# Patient Record
Sex: Female | Born: 1980
Health system: Southern US, Community
[De-identification: ages and names within clinical notes are randomized; demographics above are authoritative.]

## PROBLEM LIST (undated history)

## (undated) DIAGNOSIS — O99019 Anemia complicating pregnancy, unspecified trimester: Secondary | ICD-10-CM

## (undated) DIAGNOSIS — R87619 Unspecified abnormal cytological findings in specimens from cervix uteri: Secondary | ICD-10-CM

## (undated) DIAGNOSIS — R0789 Other chest pain: Principal | ICD-10-CM

## (undated) DIAGNOSIS — D509 Iron deficiency anemia, unspecified: Secondary | ICD-10-CM

## (undated) DIAGNOSIS — I10 Essential (primary) hypertension: Secondary | ICD-10-CM

## (undated) DIAGNOSIS — O139 Gestational [pregnancy-induced] hypertension without significant proteinuria, unspecified trimester: Secondary | ICD-10-CM

## (undated) DIAGNOSIS — IMO0002 Reserved for concepts with insufficient information to code with codable children: Secondary | ICD-10-CM

## (undated) HISTORY — DX: Other chest pain: R07.89

## (undated) HISTORY — PX: LEEP: SHX91

## (undated) HISTORY — DX: Essential (primary) hypertension: I10

---

## 2003-01-17 ENCOUNTER — Emergency Department (HOSPITAL_COMMUNITY): Admission: EM | Admit: 2003-01-17 | Discharge: 2003-01-17 | Payer: Self-pay | Admitting: *Deleted

## 2003-01-17 ENCOUNTER — Encounter: Payer: Self-pay | Admitting: *Deleted

## 2009-05-27 ENCOUNTER — Emergency Department (HOSPITAL_COMMUNITY): Admission: EM | Admit: 2009-05-27 | Discharge: 2009-05-27 | Payer: Self-pay | Admitting: Family Medicine

## 2011-01-04 LAB — ABO/RH: RH Type: POSITIVE

## 2011-01-04 LAB — HIV ANTIBODY (ROUTINE TESTING W REFLEX): HIV: NONREACTIVE

## 2011-01-04 LAB — RUBELLA ANTIBODY, IGM: Rubella: IMMUNE

## 2011-01-04 LAB — HEPATITIS B SURFACE ANTIGEN: Hepatitis B Surface Ag: NEGATIVE

## 2011-01-04 LAB — RPR: RPR: NONREACTIVE

## 2011-01-04 LAB — ANTIBODY SCREEN: Antibody Screen: NEGATIVE

## 2011-07-04 ENCOUNTER — Encounter (HOSPITAL_COMMUNITY): Payer: Self-pay | Admitting: *Deleted

## 2011-07-04 ENCOUNTER — Inpatient Hospital Stay (HOSPITAL_COMMUNITY)
Admission: AD | Admit: 2011-07-04 | Discharge: 2011-07-04 | Disposition: A | Discharge status: Home / Self Care | Payer: 59 | Source: Ambulatory Visit | Attending: Obstetrics & Gynecology | Admitting: Obstetrics & Gynecology

## 2011-07-04 ENCOUNTER — Inpatient Hospital Stay (HOSPITAL_COMMUNITY): Payer: 59

## 2011-07-04 DIAGNOSIS — O10919 Unspecified pre-existing hypertension complicating pregnancy, unspecified trimester: Secondary | ICD-10-CM

## 2011-07-04 DIAGNOSIS — O10019 Pre-existing essential hypertension complicating pregnancy, unspecified trimester: Secondary | ICD-10-CM | POA: Insufficient documentation

## 2011-07-04 HISTORY — DX: Essential (primary) hypertension: I10

## 2011-07-04 LAB — COMPREHENSIVE METABOLIC PANEL
ALT: 14 U/L (ref 0–35)
AST: 17 U/L (ref 0–37)
Albumin: 2.4 g/dL — ABNORMAL LOW (ref 3.5–5.2)
Alkaline Phosphatase: 116 U/L (ref 39–117)
BUN: 8 mg/dL (ref 6–23)
CO2: 26 mEq/L (ref 19–32)
Calcium: 10 mg/dL (ref 8.4–10.5)
Chloride: 104 mEq/L (ref 96–112)
Creatinine, Ser: 0.5 mg/dL (ref 0.50–1.10)
GFR calc Af Amer: >60 mL/min (ref >60)
GFR calc non Af Amer: >60 mL/min (ref >60)
Glucose, Bld: 95 mg/dL (ref 70–99)
Potassium: 3.1 mEq/L — ABNORMAL LOW (ref 3.5–5.1)
Sodium: 136 mEq/L (ref 135–145)
Total Bilirubin: 0.1 mg/dL — ABNORMAL LOW (ref 0.3–1.2)
Total Protein: 6.4 g/dL (ref 6.0–8.3)

## 2011-07-04 LAB — CBC
HCT: 32.1 % — ABNORMAL LOW (ref 36.0–46.0)
Hemoglobin: 10.7 g/dL — ABNORMAL LOW (ref 12.0–15.0)
MCH: 28.1 pg (ref 26.0–34.0)
MCHC: 33.3 g/dL (ref 30.0–36.0)
MCV: 84.3 fL (ref 78.0–100.0)
Platelets: 275 10*3/uL (ref 150–400)
RBC: 3.81 MIL/uL — ABNORMAL LOW (ref 3.87–5.11)
RDW: 13.8 % (ref 11.5–15.5)
WBC: 11.6 10*3/uL — ABNORMAL HIGH (ref 4.0–10.5)

## 2011-07-04 LAB — URIC ACID: Uric Acid, Serum: 3.6 mg/dL (ref 2.4–7.0)

## 2011-07-04 MED ORDER — ACETAMINOPHEN 500 MG PO TABS
1000.0000 mg | ORAL_TABLET | Freq: Once | ORAL | Status: AC
  Administered 2011-07-04: 1000 mg via ORAL
  Filled 2011-07-04: qty 2

## 2011-07-04 MED ORDER — LABETALOL HCL 100 MG PO TABS: 100.0000 mg | ORAL_TABLET | Freq: Two times a day (BID) | ORAL | Status: DC

## 2011-07-04 MED ORDER — POTASSIUM CHLORIDE CRYS ER 20 MEQ PO TBCR: 40.0000 meq | EXTENDED_RELEASE_TABLET | Freq: Two times a day (BID) | ORAL | Status: DC

## 2011-07-04 MED ORDER — POTASSIUM CHLORIDE CRYS ER 20 MEQ PO TBCR
40.0000 meq | EXTENDED_RELEASE_TABLET | Freq: Once | ORAL | Status: AC
  Administered 2011-07-04: 40 meq via ORAL
  Filled 2011-07-04: qty 2

## 2011-07-04 MED ORDER — LABETALOL HCL 100 MG PO TABS
100.0000 mg | ORAL_TABLET | Freq: Two times a day (BID) | ORAL | Status: DC
  Administered 2011-07-04: 100 mg via ORAL
  Filled 2011-07-04: qty 1

## 2011-07-04 NOTE — Progress Notes (Signed)
Pt sent over from office for pih eval, reports headache that started last night.  Reports increased swelling in past couple days.  Denies any dizziness or blurred vision.  Denies any bleeding or leaking of fluid.  + FM.

## 2011-07-04 NOTE — Progress Notes (Signed)
History   30 yo G1P0 BF w/ underlying HTN on no med now @ [redacted] wk gestation sent from the office for further evaluation of elev BP( 150./90), persistent h/a and leg swelling. (+) heartburn w/o change. 4lb weight gain since last tues (-)visual changes  Chief Complaint  Patient presents with  . Hypertension  . Headache     OB History    Grav Para Term Preterm Abortions TAB SAB Ect Mult Living   1               Past Medical History  Diagnosis Date  . Hypertension     Past Surgical History  Procedure Date  . No past surgeries   . Leep     No family history on file.  History  Substance Use Topics  . Smoking status: Never Smoker   . Smokeless tobacco: Not on file  . Alcohol Use: No    Allergies: No Known Allergies  Prescriptions prior to admission  Medication Sig Dispense Refill  . acetaminophen (TYLENOL) 500 MG tablet Take 500 mg by mouth every 6 (six) hours as needed.        Marland Kitchen levocetirizine (XYZAL) 5 MG tablet Take 5 mg by mouth every evening.        . prenatal vitamin w/FE, FA (PRENATAL 1 + 1) 27-1 MG TABS Take 1 tablet by mouth daily.           Physical Exam   Blood pressure 153/73, pulse 78, temperature 98.1 F (36.7 C), temperature source Oral, resp. rate 18, height 5\' 9"  (1.753 m), weight 102.967 kg (227 lb).  No exam performed today,  done in office today. ED Course  PIH labs nl.  U/s: 5lb 10 oz(59%) , nl AFI  A) Chronic HTN w/o superimposed preeclampsia IUP @ 35 wk 3) hypokalemia P) d/c home. Toxemia warning signs. Keep ob appt Fri. Start labetalol 100mg  po bid. May use vicodin prn h/a. Start K supplementation. D/c home after reactive NST. Tylenol 1000mg  po x 1 now  Tekila Caillouet A, MD 6:05 PM 07/04/2011

## 2011-07-04 NOTE — Discharge Instructions (Signed)
Call if decreased FM, ctx q 5 mins or greater, rupture of membrane

## 2011-07-19 ENCOUNTER — Encounter (HOSPITAL_COMMUNITY): Payer: Self-pay | Admitting: *Deleted

## 2011-07-19 ENCOUNTER — Inpatient Hospital Stay (HOSPITAL_COMMUNITY): Payer: 59

## 2011-07-19 ENCOUNTER — Inpatient Hospital Stay (HOSPITAL_COMMUNITY)
Admission: AD | Admit: 2011-07-19 | Discharge: 2011-07-24 | DRG: 765 | Disposition: A | Discharge status: Home / Self Care | Payer: 59 | Source: Ambulatory Visit | Attending: Obstetrics and Gynecology | Admitting: Obstetrics and Gynecology

## 2011-07-19 DIAGNOSIS — O9903 Anemia complicating the puerperium: Secondary | ICD-10-CM | POA: Diagnosis present

## 2011-07-19 DIAGNOSIS — O139 Gestational [pregnancy-induced] hypertension without significant proteinuria, unspecified trimester: Secondary | ICD-10-CM | POA: Diagnosis present

## 2011-07-19 DIAGNOSIS — D509 Iron deficiency anemia, unspecified: Secondary | ICD-10-CM | POA: Diagnosis present

## 2011-07-19 DIAGNOSIS — O99019 Anemia complicating pregnancy, unspecified trimester: Secondary | ICD-10-CM

## 2011-07-19 DIAGNOSIS — O411090 Infection of amniotic sac and membranes, unspecified, unspecified trimester, not applicable or unspecified: Secondary | ICD-10-CM | POA: Diagnosis present

## 2011-07-19 HISTORY — DX: Anemia complicating pregnancy, unspecified trimester: O99.019

## 2011-07-19 HISTORY — DX: Iron deficiency anemia, unspecified: D50.9

## 2011-07-19 HISTORY — DX: Gestational (pregnancy-induced) hypertension without significant proteinuria, unspecified trimester: O13.9

## 2011-07-19 HISTORY — DX: Unspecified abnormal cytological findings in specimens from cervix uteri: R87.619

## 2011-07-19 HISTORY — DX: Reserved for concepts with insufficient information to code with codable children: IMO0002

## 2011-07-19 LAB — COMPREHENSIVE METABOLIC PANEL
ALT: 8 U/L (ref 0–35)
ALT: 9 U/L (ref 0–35)
AST: 15 U/L (ref 0–37)
AST: 19 U/L (ref 0–37)
Albumin: 2.3 g/dL — ABNORMAL LOW (ref 3.5–5.2)
Albumin: 2.5 g/dL — ABNORMAL LOW (ref 3.5–5.2)
Alkaline Phosphatase: 137 U/L — ABNORMAL HIGH (ref 39–117)
Alkaline Phosphatase: 149 U/L — ABNORMAL HIGH (ref 39–117)
BUN: 8 mg/dL (ref 6–23)
BUN: 8 mg/dL (ref 6–23)
CO2: 24 mEq/L (ref 19–32)
CO2: 24 mEq/L (ref 19–32)
Calcium: 9.5 mg/dL (ref 8.4–10.5)
Calcium: 9.8 mg/dL (ref 8.4–10.5)
Chloride: 106 mEq/L (ref 96–112)
Chloride: 103 mEq/L (ref 96–112)
Creatinine, Ser: 0.53 mg/dL (ref 0.50–1.10)
Creatinine, Ser: 0.47 mg/dL — ABNORMAL LOW (ref 0.50–1.10)
GFR calc Af Amer: >90 mL/min (ref >90)
GFR calc Af Amer: >90 mL/min (ref >90)
GFR calc non Af Amer: >90 mL/min (ref >90)
GFR calc non Af Amer: >90 mL/min (ref >90)
Glucose, Bld: 100 mg/dL — ABNORMAL HIGH (ref 70–99)
Glucose, Bld: 108 mg/dL — ABNORMAL HIGH (ref 70–99)
Potassium: 3.9 mEq/L (ref 3.5–5.1)
Potassium: 4.2 mEq/L (ref 3.5–5.1)
Sodium: 137 mEq/L (ref 135–145)
Sodium: 135 mEq/L (ref 135–145)
Total Bilirubin: 0.1 mg/dL — ABNORMAL LOW (ref 0.3–1.2)
Total Bilirubin: 0.1 mg/dL — ABNORMAL LOW (ref 0.3–1.2)
Total Protein: 6.2 g/dL (ref 6.0–8.3)
Total Protein: 6.5 g/dL (ref 6.0–8.3)

## 2011-07-19 LAB — CBC
HCT: 32.9 % — ABNORMAL LOW (ref 36.0–46.0)
HCT: 34.4 % — ABNORMAL LOW (ref 36.0–46.0)
Hemoglobin: 10.7 g/dL — ABNORMAL LOW (ref 12.0–15.0)
Hemoglobin: 11.2 g/dL — ABNORMAL LOW (ref 12.0–15.0)
MCH: 27.4 pg (ref 26.0–34.0)
MCH: 27.5 pg (ref 26.0–34.0)
MCHC: 32.5 g/dL (ref 30.0–36.0)
MCHC: 32.6 g/dL (ref 30.0–36.0)
MCV: 84.4 fL (ref 78.0–100.0)
MCV: 84.3 fL (ref 78.0–100.0)
Platelets: 283 10*3/uL (ref 150–400)
Platelets: 309 10*3/uL (ref 150–400)
RBC: 3.9 MIL/uL (ref 3.87–5.11)
RBC: 4.08 MIL/uL (ref 3.87–5.11)
RDW: 13.8 % (ref 11.5–15.5)
RDW: 13.9 % (ref 11.5–15.5)
WBC: 9.9 10*3/uL (ref 4.0–10.5)
WBC: 10.5 10*3/uL (ref 4.0–10.5)

## 2011-07-19 LAB — RPR: RPR Ser Ql: NONREACTIVE

## 2011-07-19 LAB — URIC ACID
Uric Acid, Serum: 4 mg/dL (ref 2.4–7.0)
Uric Acid, Serum: 3.9 mg/dL (ref 2.4–7.0)

## 2011-07-19 MED ORDER — SODIUM CHLORIDE 0.9 % IJ SOLN: 3.0000 mL | INTRAMUSCULAR | Status: DC | PRN

## 2011-07-19 MED ORDER — LACTATED RINGERS IV SOLN: 500.0000 mL | INTRAVENOUS | Status: DC | PRN

## 2011-07-19 MED ORDER — CALCIUM CARBONATE ANTACID 500 MG PO CHEW: 2.0000 | CHEWABLE_TABLET | ORAL | Status: DC | PRN

## 2011-07-19 MED ORDER — ACETAMINOPHEN 325 MG PO TABS
650.0000 mg | ORAL_TABLET | ORAL | Status: DC | PRN
  Administered 2011-07-19 – 2011-07-21 (×2): 650 mg via ORAL
  Filled 2011-07-19 (×2): qty 2

## 2011-07-19 MED ORDER — ZOLPIDEM TARTRATE 10 MG PO TABS: 10.0000 mg | ORAL_TABLET | Freq: Every evening | ORAL | Status: DC | PRN

## 2011-07-19 MED ORDER — OXYTOCIN 10 UNIT/ML IJ SOLN: 10.0000 [IU] | Freq: Once | INTRAMUSCULAR | Status: DC

## 2011-07-19 MED ORDER — OXYTOCIN BOLUS FROM INFUSION
500.0000 mL | Freq: Once | INTRAVENOUS | Status: DC
  Filled 2011-07-19: qty 500

## 2011-07-19 MED ORDER — NIFEDIPINE ER 30 MG PO TB24
30.0000 mg | ORAL_TABLET | Freq: Every day | ORAL | Status: DC
  Administered 2011-07-19 – 2011-07-24 (×6): 30 mg via ORAL
  Filled 2011-07-19 (×8): qty 1

## 2011-07-19 MED ORDER — LACTATED RINGERS IV SOLN
INTRAVENOUS | Status: DC
  Administered 2011-07-19 – 2011-07-21 (×5): via INTRAVENOUS

## 2011-07-19 MED ORDER — DOCUSATE SODIUM 100 MG PO CAPS
100.0000 mg | ORAL_CAPSULE | Freq: Every day | ORAL | Status: DC
  Administered 2011-07-20: 100 mg via ORAL
  Filled 2011-07-19: qty 1

## 2011-07-19 MED ORDER — IBUPROFEN 600 MG PO TABS: 600.0000 mg | ORAL_TABLET | Freq: Four times a day (QID) | ORAL | Status: DC | PRN

## 2011-07-19 MED ORDER — TERBUTALINE SULFATE 1 MG/ML IJ SOLN: 0.2500 mg | Freq: Once | INTRAMUSCULAR | Status: AC | PRN

## 2011-07-19 MED ORDER — OXYTOCIN 20 UNITS IN LACTATED RINGERS INFUSION - SIMPLE: 125.0000 mL/h | Freq: Once | INTRAVENOUS | Status: DC

## 2011-07-19 MED ORDER — LIDOCAINE HCL (PF) 1 % IJ SOLN: 30.0000 mL | INTRAMUSCULAR | Status: DC | PRN

## 2011-07-19 MED ORDER — HYDRALAZINE HCL 20 MG/ML IJ SOLN
5.0000 mg | Freq: Once | INTRAMUSCULAR | Status: AC
  Administered 2011-07-19: 5 mg via INTRAVENOUS
  Filled 2011-07-19: qty 1

## 2011-07-19 MED ORDER — CITRIC ACID-SODIUM CITRATE 334-500 MG/5ML PO SOLN
30.0000 mL | ORAL | Status: DC | PRN
  Administered 2011-07-21: 30 mL via ORAL
  Filled 2011-07-19: qty 15

## 2011-07-19 MED ORDER — ACETAMINOPHEN 325 MG PO TABS: 650.0000 mg | ORAL_TABLET | ORAL | Status: DC | PRN

## 2011-07-19 MED ORDER — PRENATAL PLUS 27-1 MG PO TABS
1.0000 | ORAL_TABLET | Freq: Every day | ORAL | Status: DC
  Filled 2011-07-19: qty 1

## 2011-07-19 MED ORDER — ONDANSETRON HCL 4 MG/2ML IJ SOLN: 4.0000 mg | Freq: Four times a day (QID) | INTRAMUSCULAR | Status: DC | PRN

## 2011-07-19 MED ORDER — MISOPROSTOL 25 MCG QUARTER TABLET
25.0000 ug | ORAL_TABLET | ORAL | Status: DC | PRN
  Administered 2011-07-19 – 2011-07-20 (×3): 25 ug via VAGINAL
  Filled 2011-07-19 (×3): qty 0.25

## 2011-07-19 MED ORDER — SODIUM CHLORIDE 0.9 % IJ SOLN: 3.0000 mL | Freq: Two times a day (BID) | INTRAMUSCULAR | Status: DC

## 2011-07-19 MED ORDER — OXYCODONE-ACETAMINOPHEN 5-325 MG PO TABS
2.0000 | ORAL_TABLET | ORAL | Status: DC | PRN
  Administered 2011-07-19: 1 via ORAL
  Filled 2011-07-19: qty 2

## 2011-07-19 MED ORDER — FLEET ENEMA 7-19 GM/118ML RE ENEM: 1.0000 | ENEMA | RECTAL | Status: DC | PRN

## 2011-07-19 MED ORDER — SODIUM CHLORIDE 0.9 % IV SOLN: 250.0000 mL | INTRAVENOUS | Status: DC

## 2011-07-19 NOTE — H&P (Signed)
Sabrina [name] is a 30 y.o. female presenting@ 37 1/[redacted] wk gestation for further evaluation of elev BP in office( 146/98) w/ complaint of h/a and seeing spots. Pt has had a 6lb weight gain in a week. (+) heartburn unchanged. (+) increased leg swelling. BP here 131/80 to 140/104. PIH labs nl Fetal growth 6lb 1 oz(39%) but decreased since last study 1 mth ago Maternal Medical History:  Reason for admission: Reason for Admission:   nausea  OB History    Grav Para Term Preterm Abortions TAB SAB Ect Mult Living   1              Past Medical History  Diagnosis Date  . Hypertension   . Pregnancy induced hypertension   . Abnormal Pap smear    Past Surgical History  Procedure Date  . Leep    Family History: family history is not on file. Social History:  reports that she has never smoked. She has never used smokeless tobacco. She reports that she does not drink alcohol or use illicit drugs.  Review of Systems  Eyes: Negative for blurred vision.  Gastrointestinal: Negative for nausea, vomiting and abdominal pain.  All other systems reviewed and are negative.      Blood pressure 131/80, pulse 78, temperature 99.3 F (37.4 C), temperature source Oral, resp. rate 18, height 5\' 9"  (1.753 m), weight 230 lb (104.327 kg). Exam Physical Exam  Constitutional: She is oriented to person, place, and time. She appears well-developed and well-nourished.  HENT:  Head: Normocephalic and atraumatic.  Eyes: EOM are normal.  Neck: Neck supple.  Cardiovascular: Normal rate and regular rhythm.   Respiratory: Breath sounds normal.  GI: Soft.  Musculoskeletal: She exhibits edema.       2 plus pitting edema  Neurological: She is alert and oriented to person, place, and time. She displays normal reflexes.  Skin: Skin is warm and dry.  Psychiatric: She has a normal mood and affect.   VE dimple/80/-2 Prenatal labs: ABO, Rh:  O positive Antibody:  neg Rubella:  Immune RPR:   NR HBsAg:   neg HIV:    neg GBS:   neg Tracing reactive. Baseline 130 Assessment/Plan: PIH Labile HTN IUP @ 37 1/7 P) admit 2) monitor BP 3) start 24 hr UTP/CrCL, 4) procardia XL( pt did not tol labetalol in the past) 5) induction if BP still labile  Sabrina [name] A 07/19/2011, 1:18 PM

## 2011-07-19 NOTE — ED Provider Notes (Signed)
History   30 yo G1P0 BF now @ 37 1/[redacted] wk gestation sent from office for further evaluation of elev BP 146/98 w/ c/o leg swelling, seeing spots and h/a.   Chief Complaint  Patient presents with  . Hypertension     OB History    Grav Para Term Preterm Abortions TAB SAB Ect Mult Living   1               Past Medical History  Diagnosis Date  . Hypertension   . Pregnancy induced hypertension   . Abnormal Pap smear     Past Surgical History  Procedure Date  . Leep     No family history on file.  History  Substance Use Topics  . Smoking status: Never Smoker   . Smokeless tobacco: Never Used  . Alcohol Use: No    Allergies: No Known Allergies  Prescriptions prior to admission  Medication Sig Dispense Refill  . acetaminophen (TYLENOL) 500 MG tablet Take 500 mg by mouth every 6 (six) hours as needed. For headache      . levocetirizine (XYZAL) 5 MG tablet Take 5 mg by mouth every evening.        . prenatal vitamin w/FE, FA (PRENATAL 1 + 1) 27-1 MG TABS Take 1 tablet by mouth daily.        Marland Kitchen labetalol (NORMODYNE) 100 MG tablet Take 1 tablet (100 mg total) by mouth 2 (two) times daily.  60 tablet  4  . potassium chloride SA (K-DUR,KLOR-CON) 20 MEQ tablet Take 2 tablets (40 mEq total) by mouth 2 (two) times daily.  6 tablet  0     Physical Exam   Blood pressure 131/80, pulse 78, temperature 99.3 F (37.4 C), temperature source Oral, resp. rate 18, height 5\' 9"  (1.753 m), weight 230 lb (104.327 kg).  No exam performed today, done in office. PIH labs nl ED Course  PIH/chronic HTN IUP @ 37 1/7 wk P) admit for BP monitoring. Start 24 hr UTP/crCL. Pt did not tol labetalol in the past. Apresoline prn MDM  Joselyn Edling A, MD 1:29 PM 07/19/2011

## 2011-07-19 NOTE — Progress Notes (Signed)
Sabrina [name] is a 30 y.o. G1P0 at [redacted]w[redacted]d by ultrasound admitted for [facility]  Subjective:  C/o headache no visual change. Had Procardia XL @ 2 25p. Denies heartburn. Apresoline given 6:30p for elev diastolic BP>100. Cytotec #1 in place  Chief Complaint  Patient presents with  . Hypertension    Objective: BP 138/91  Pulse 84  Temp(Src) 98.1 F (36.7 C) (Oral)  Resp 18  Ht 5\' 9"  (1.753 m)  Wt 231 lb 4.8 oz (104.917 kg)  BMI 34.16 kg/m2 I/O last 3 completed shifts: In: 370 [P.O.:370] Out: 510 [Urine:510]    FHT:  FHR: 130 bpm, variability: moderate,  accelerations:  Present,  decelerations:  Absent UC:   occ SVE:   deferred Labs: Lab Results  Component Value Date   WBC 10.5 07/19/2011   HGB 11.2* 07/19/2011   HCT 34.4* 07/19/2011   MCV 84.3 07/19/2011   PLT 309 07/19/2011   VE: deferred.  PIH labs nl Assessment / Plan  PIH IUP @ 37 wk  P) percocet for h/a. Cont cytotec for labor induction. IV apresoline prn.  Cont procardia   Anticipated MOD:  too early to determine  Sabrina [name] A 07/19/2011, 10:02 PM

## 2011-07-19 NOTE — Progress Notes (Signed)
Sabrina [name] is [name] 30 y.o. G1P0 at [redacted]w[redacted]d by ultrasound admitted for [facility]. Pt received Procardia XL. Min urine output.   Subjective: Chief Complaint  Patient presents with  . Hypertension    Objective: BP 154/110  Pulse 99  Temp(Src) 99.3 F (37.4 C) (Oral)  Resp 18  Ht 5\' 9"  (1.753 m)  Wt 231 lb 4.8 oz (104.917 kg)  BMI 34.16 kg/m2   Total I/O In: 370 [P.O.:370] Out: 310 [Urine:310]  FHT:  FHR: 135 bpm, variability: moderate,  accelerations:  Present,  decelerations:  Absent UC:   none SVE:   0.5 cm dilated, 80% effaced, -2 station, presenting part vtx Tracing: reactive  Labs: Lab Results  Component Value Date   WBC 9.9 07/19/2011   HGB 10.7* 07/19/2011   HCT 32.9* 07/19/2011   MCV 84.4 07/19/2011   PLT 283 07/19/2011    Assessment / Plan: PIH w/o evidence of preeclampsia IUP @ 37 1/7 P)start cytotec induction 2) repeat  PIH labs 3) Apresoline for acute BP mgmt Anticipated MOD:  unknown  Sabrina [name] 07/19/2011, 6:11 PM

## 2011-07-19 NOTE — Progress Notes (Signed)
Elevated BP in office. Sent for PIH screening. Will call MD for orders

## 2011-07-19 NOTE — ED Notes (Signed)
Office called in, PIH eval.  BP up and 6#wk gain in past wk.

## 2011-07-20 ENCOUNTER — Inpatient Hospital Stay (HOSPITAL_COMMUNITY): Payer: 59 | Admitting: Anesthesiology

## 2011-07-20 ENCOUNTER — Encounter (HOSPITAL_COMMUNITY): Payer: Self-pay | Admitting: Anesthesiology

## 2011-07-20 LAB — COMPREHENSIVE METABOLIC PANEL
ALT: 8 U/L (ref 0–35)
AST: 15 U/L (ref 0–37)
Albumin: 2.3 g/dL — ABNORMAL LOW (ref 3.5–5.2)
Alkaline Phosphatase: 139 U/L — ABNORMAL HIGH (ref 39–117)
BUN: 6 mg/dL (ref 6–23)
CO2: 23 mEq/L (ref 19–32)
Calcium: 9.3 mg/dL (ref 8.4–10.5)
Chloride: 106 mEq/L (ref 96–112)
Creatinine, Ser: <0.47 mg/dL — ABNORMAL LOW (ref 0.50–1.10)
Glucose, Bld: 98 mg/dL (ref 70–99)
Potassium: 3.7 mEq/L (ref 3.5–5.1)
Sodium: 135 mEq/L (ref 135–145)
Total Bilirubin: 0.2 mg/dL — ABNORMAL LOW (ref 0.3–1.2)
Total Protein: 6.2 g/dL (ref 6.0–8.3)

## 2011-07-20 LAB — CREATININE CLEARANCE, URINE, 24 HOUR
Collection Interval-CRCL: 24 hours
Creatinine, 24H Ur: 1495 mg/d (ref 700–1800)
Creatinine, Urine: 45.29 mg/dL
Creatinine: <0.47 mg/dL — ABNORMAL LOW (ref 0.50–1.10)
Urine Total Volume-CRCL: 3300 mL

## 2011-07-20 LAB — URIC ACID: Uric Acid, Serum: 3.5 mg/dL (ref 2.4–7.0)

## 2011-07-20 MED ORDER — DIPHENHYDRAMINE HCL 50 MG/ML IJ SOLN: 12.5000 mg | INTRAMUSCULAR | Status: DC | PRN

## 2011-07-20 MED ORDER — PHENYLEPHRINE 40 MCG/ML (10ML) SYRINGE FOR IV PUSH (FOR BLOOD PRESSURE SUPPORT): 80.0000 ug | PREFILLED_SYRINGE | INTRAVENOUS | Status: DC | PRN

## 2011-07-20 MED ORDER — EPHEDRINE 5 MG/ML INJ: 10.0000 mg | INTRAVENOUS | Status: DC | PRN

## 2011-07-20 MED ORDER — NALBUPHINE SYRINGE 5 MG/0.5 ML
10.0000 mg | INJECTION | Freq: Once | INTRAMUSCULAR | Status: AC
  Administered 2011-07-20: 10 mg via INTRAMUSCULAR
  Filled 2011-07-20: qty 1

## 2011-07-20 MED ORDER — FENTANYL 2.5 MCG/ML BUPIVACAINE 1/10 % EPIDURAL INFUSION (WH - ANES)
14.0000 mL/h | INTRAMUSCULAR | Status: DC
  Administered 2011-07-21 (×2): 14 mL/h via EPIDURAL
  Filled 2011-07-20 (×3): qty 60

## 2011-07-20 MED ORDER — LACTATED RINGERS IV SOLN: 500.0000 mL | Freq: Once | INTRAVENOUS | Status: DC

## 2011-07-20 MED ORDER — HYDROXYZINE HCL 50 MG/ML IM SOLN
50.0000 mg | Freq: Once | INTRAMUSCULAR | Status: AC
  Administered 2011-07-20: 50 mg via INTRAMUSCULAR
  Filled 2011-07-20: qty 1

## 2011-07-20 MED ORDER — NALBUPHINE SYRINGE 5 MG/0.5 ML
10.0000 mg | INJECTION | Freq: Once | INTRAMUSCULAR | Status: AC
  Administered 2011-07-20: 10 mg via INTRAVENOUS
  Filled 2011-07-20: qty 1

## 2011-07-20 MED ORDER — LIDOCAINE HCL 1.5 % IJ SOLN
INTRAMUSCULAR | Status: DC | PRN
  Administered 2011-07-20 (×2): 5 mL via EPIDURAL

## 2011-07-20 MED ORDER — EPHEDRINE 5 MG/ML INJ
10.0000 mg | INTRAVENOUS | Status: DC | PRN
  Filled 2011-07-20: qty 4

## 2011-07-20 MED ORDER — PHENYLEPHRINE 40 MCG/ML (10ML) SYRINGE FOR IV PUSH (FOR BLOOD PRESSURE SUPPORT)
80.0000 ug | PREFILLED_SYRINGE | INTRAVENOUS | Status: DC | PRN
  Filled 2011-07-20: qty 5

## 2011-07-20 MED ORDER — SODIUM CHLORIDE 0.9 % IV SOLN
3.0000 g | Freq: Four times a day (QID) | INTRAVENOUS | Status: DC
  Filled 2011-07-20 (×3): qty 3

## 2011-07-20 MED ORDER — FENTANYL 2.5 MCG/ML BUPIVACAINE 1/10 % EPIDURAL INFUSION (WH - ANES)
INTRAMUSCULAR | Status: DC | PRN
  Administered 2011-07-20: 14 mL/h via EPIDURAL

## 2011-07-20 MED ORDER — OXYTOCIN 20 UNITS IN LACTATED RINGERS INFUSION - SIMPLE
1.0000 m[IU]/min | INTRAVENOUS | Status: DC
  Administered 2011-07-20: 2 m[IU]/min via INTRAVENOUS
  Filled 2011-07-20: qty 1000

## 2011-07-20 NOTE — Plan of Care (Signed)
Problem: Consults Goal: Birthing Suites Patient Information Press F2 to bring up selections list  Outcome: Completed/Met Date Met:  07/20/11  Pt 37-[redacted] weeks EGA

## 2011-07-20 NOTE — Progress Notes (Signed)
Sabrina [name] is [name] 30 y.o. G1P0 at [redacted]w[redacted]d by ultrasound admitted for [facility]  Subjective: s/p cytotec. Hx LEEP. H/[name] resolved ? Early URI/sinus Chief Complaint  Patient presents with  . Hypertension    Objective: BP 139/92  Pulse 76  Temp(Src) 98.4 F (36.9 C) (Oral)  Resp 18  Ht 5\' 9"  (1.753 m)  Wt 231 lb 4.8 oz (104.917 kg)  BMI 34.16 kg/m2 I/O last 3 completed shifts: In: 1109.6 [P.O.:370; I.V.:739.6] Out: 2810 [Urine:2810] Total I/O In: -  Out: 200 [Urine:200] intracervical Foley balloon placed w/o difficulty  FHT:  FHR: 135 bpm, variability: moderate,  accelerations:  Present,  decelerations:  Absent UC:   irregular, every 2-4 minutes SVE:  Dimple/80%/-1 Labs: Lab Results  Component Value Date   WBC 10.5 07/19/2011   HGB 11.2* 07/19/2011   HCT 34.4* 07/19/2011   MCV 84.3 07/19/2011   PLT 309 07/19/2011    Assessment / Plan: PIH Hx LEEP IUP @37  + wk p)  Start pitocin. Cont procardia XL. 24 hr UTP/CrCL still in progress  Anticipated MOD:  NSVD  Sabrina [name] 07/20/2011, 8:04 AM

## 2011-07-20 NOTE — Anesthesia Preprocedure Evaluation (Signed)
Anesthesia Evaluation  Name, MR# and DOB Patient awake  General Assessment Comment  Reviewed: Allergy & Precautions, H&P , NPO status , Patient's Chart, lab work & pertinent test results  Airway Mallampati: II    Mouth opening: Limited Mouth Opening  Dental No notable dental hx.    Pulmonary    Pulmonary exam normal       Cardiovascular     Neuro/Psych Negative Neurological ROS  Negative Psych ROS   GI/Hepatic negative GI ROS Neg liver ROS    Endo/Other  Negative Endocrine ROS  Renal/GU negative Renal ROS  Genitourinary negative   Musculoskeletal negative musculoskeletal ROS (+)   Abdominal Normal abdominal exam  (+)   Peds negative pediatric ROS (+)  Hematology negative hematology ROS (+)   Anesthesia Other Findings   Reproductive/Obstetrics negative OB ROS                           Anesthesia Physical Anesthesia Plan  ASA: II  Anesthesia Plan: Epidural   Post-op Pain Management:    Induction:   Airway Management Planned:   Additional Equipment:   Intra-op Plan:   Post-operative Plan:   Informed Consent:   Plan Discussed with:   Anesthesia Plan Comments:         Anesthesia Quick Evaluation

## 2011-07-20 NOTE — Anesthesia Procedure Notes (Signed)
Epidural Patient location during procedure: OB Start time: 07/20/2011 9:19 PM End time: 07/20/2011 9:25 PM Reason for block: procedure for pain  Staffing Anesthesiologist: Sandrea Hughs Performed by: anesthesiologist   Preanesthetic Checklist Completed: patient identified, site marked, surgical consent, pre-op evaluation, timeout performed, IV checked, risks and benefits discussed and monitors and equipment checked  Epidural Patient position: sitting Prep: site prepped and draped and DuraPrep Patient monitoring: continuous pulse ox and blood pressure Approach: midline Injection technique: LOR air  Needle:  Needle type: Tuohy  Needle gauge: 17 G Needle length: 9 cm Needle insertion depth: 8 cm Catheter type: closed end flexible Catheter size: 19 Gauge Catheter at skin depth: 14 cm Test dose: negative and 1.5% lidocaine  Assessment Sensory level: T8 Events: blood not aspirated, injection not painful, no injection resistance, negative IV test and no paresthesia

## 2011-07-20 NOTE — Plan of Care (Signed)
Problem: Consults Goal: Birthing Suites Patient Information Press F2 to bring up selections list  Outcome: Completed/Met Date Met:  07/20/11  Pt 37-[redacted] weeks EGA     

## 2011-07-20 NOTE — Progress Notes (Signed)
Sabrina [name] is a 30 y.o. G1P0 at [redacted]w[redacted]d by LMP admitted for [facility]  Subjective: Chief Complaint  Patient presents with  . Hypertension   Epidural. Temp 100.3  Pitocin Objective: BP 141/74  Pulse 80  Temp(Src) 98.5 F (36.9 C) (Oral)  Resp 18  Ht 5\' 9"  (1.753 m)  Wt 231 lb 4.8 oz (104.917 kg)  BMI 34.16 kg/m2 I/O last 3 completed shifts: In: 1109.6 [P.O.:370; I.V.:739.6] Out: 3260 [Urine:3260]    FHT:  FHR: 155 bpm, variability: moderate,  accelerations:  Present,  decelerations:  Absent UC:   regular, every 2 minutes SVE:   4/100/-1 Tracing:reactive w/ dysfunctional labor pattern Labs: Lab Results  Component Value Date   WBC 10.5 07/19/2011   HGB 11.2* 07/19/2011   HCT 34.4* 07/19/2011   MCV 84.3 07/19/2011   PLT 309 07/19/2011    Assessment / Plan: active phase 2) Maternal ZFever ? Etiology. No prolonged ROM P) cbc w/ diff, ucx, Unasyn Tylenol .cont pitocin   Anticipated MOD:  if scarring of cervix resolves, then SVD  Sabrina [name] A 07/20/2011, 11:59 PM

## 2011-07-20 NOTE — Progress Notes (Signed)
SONOMA FIRKUS is a 30 y.o. G1P0 at [redacted]w[redacted]d by ultrasound admitted for induction of labor due to Hypertension.  Subjective: Chief Complaint  Patient presents with  . Hypertension    Objective: BP 151/89  Pulse 91  Temp(Src) 98.5 F (36.9 C) (Oral)  Resp 18  Ht 5\' 9"  (1.753 m)  Wt 231 lb 4.8 oz (104.917 kg)  BMI 34.16 kg/m2 I/O last 3 completed shifts: In: 1109.6 [P.O.:370; I.V.:739.6] Out: 2810 [Urine:2810] Total I/O In: -  Out: 450 [Urine:450]  FHT:  FHR: 140 bpm, variability: moderate,  accelerations:  Present,  decelerations:  Absent UC:   Ctx q 2- 3 mins SVE:  2 cm dilated, 60% effaced, -1/-2 station, consistency soft tight ring presenting part vtx Tracing: reactive  Labs: Lab Results  Component Value Date   WBC 10.5 07/19/2011   HGB 11.2* 07/19/2011   HCT 34.4* 07/19/2011   MCV 84.3 07/19/2011   PLT 309 07/19/2011    Assessment / Plan: PIH IUP @ 373/7 wk Latent phase P) close BP monitoring. Cont procardia. Cont pitocin Anticipated MOD:  NSVD Jermell Holeman A 07/20/2011, 2:51 PM

## 2011-07-20 NOTE — Progress Notes (Signed)
Sabrina [name] is a 30 y.o. G1P0 at [redacted]w[redacted]d by ultrasound admitted for PIH  Subjective: S/P Nubain for pain relief. No h/a, visual changes Chief Complaint  Patient presents with  . Hypertension    Objective: BP 141/82  Pulse 95  Temp(Src) 98.5 F (36.9 C) (Oral)  Resp 18  Ht 5\' 9"  (1.753 m)  Wt 231 lb 4.8 oz (104.917 kg)  BMI 34.16 kg/m2 I/O last 3 completed shifts: In: 1109.6 [P.O.:370; I.V.:739.6] Out: 3260 [Urine:3260]   Pitocin 22 MU  FHT:  FHR: 140 bpm, variability: moderate,  accelerations:  Present,  decelerations:  Absent UC:   regular, every ?2-4  minutes SVE:  3/60/-2 Tracing: reassuring baseline140. Arom clear fluid IUPC placed Labs: Lab Results  Component Value Date   WBC 10.5 07/19/2011   HGB 11.2* 07/19/2011   HCT 34.4* 07/19/2011   MCV 84.3 07/19/2011   PLT 309 07/19/2011  Foley balloon removed. Hx LEEP w/ scarring of cervix noted  Assessment / Plan: PIH  Mgmt w/ procardia XL   Anticipated MOD:  NSVD. P) exaggerated sims cont pitocin  Ohm Dentler A 07/20/2011, 7:09 PM

## 2011-07-21 ENCOUNTER — Encounter (HOSPITAL_COMMUNITY): Payer: Self-pay | Admitting: Anesthesiology

## 2011-07-21 ENCOUNTER — Encounter (HOSPITAL_COMMUNITY)
Admission: AD | Disposition: A | Discharge status: Home / Self Care | Payer: Self-pay | Source: Ambulatory Visit | Attending: Obstetrics and Gynecology

## 2011-07-21 ENCOUNTER — Other Ambulatory Visit: Payer: Self-pay | Admitting: Obstetrics and Gynecology

## 2011-07-21 ENCOUNTER — Encounter (HOSPITAL_COMMUNITY): Payer: Self-pay | Admitting: *Deleted

## 2011-07-21 DIAGNOSIS — O139 Gestational [pregnancy-induced] hypertension without significant proteinuria, unspecified trimester: Secondary | ICD-10-CM

## 2011-07-21 HISTORY — DX: Gestational (pregnancy-induced) hypertension without significant proteinuria, unspecified trimester: O13.9

## 2011-07-21 LAB — DIFFERENTIAL
Basophils Absolute: 0 10*3/uL (ref 0.0–0.1)
Basophils Relative: 0 % (ref 0–1)
Eosinophils Absolute: 0 10*3/uL (ref 0.0–0.7)
Eosinophils Relative: 0 % (ref 0–5)
Lymphocytes Relative: 12 % (ref 12–46)
Lymphs Abs: 1.2 10*3/uL (ref 0.7–4.0)
Monocytes Absolute: 1 10*3/uL (ref 0.1–1.0)
Monocytes Relative: 10 % (ref 3–12)
Neutro Abs: 7.5 10*3/uL (ref 1.7–7.7)
Neutrophils Relative %: 77 % (ref 43–77)

## 2011-07-21 LAB — ABO/RH: ABO/RH(D): O POS

## 2011-07-21 LAB — PROTEIN, URINE, 24 HOUR
Collection Interval-UPROT: 24 hours
Protein, 24H Urine: 231 mg/d — ABNORMAL HIGH (ref 50–100)
Protein, Urine: 7 mg/dL
Urine Total Volume-UPROT: 3300 mL

## 2011-07-21 LAB — CBC
HCT: 30.3 % — ABNORMAL LOW (ref 36.0–46.0)
Hemoglobin: 10.1 g/dL — ABNORMAL LOW (ref 12.0–15.0)
MCH: 27.8 pg (ref 26.0–34.0)
MCHC: 33.3 g/dL (ref 30.0–36.0)
MCV: 83.5 fL (ref 78.0–100.0)
Platelets: 255 10*3/uL (ref 150–400)
RBC: 3.63 MIL/uL — ABNORMAL LOW (ref 3.87–5.11)
RDW: 13.8 % (ref 11.5–15.5)
WBC: 9.7 10*3/uL (ref 4.0–10.5)

## 2011-07-21 SURGERY — Surgical Case
Anesthesia: Epidural | Site: Abdomen | Wound class: Clean Contaminated

## 2011-07-21 MED ORDER — FENTANYL CITRATE 0.05 MG/ML IJ SOLN
INTRAMUSCULAR | Status: DC | PRN
  Administered 2011-07-21: 100 ug via INTRAVENOUS

## 2011-07-21 MED ORDER — KETOROLAC TROMETHAMINE 30 MG/ML IJ SOLN: 15.0000 mg | Freq: Once | INTRAMUSCULAR | Status: DC | PRN

## 2011-07-21 MED ORDER — SENNOSIDES-DOCUSATE SODIUM 8.6-50 MG PO TABS
2.0000 | ORAL_TABLET | Freq: Every day | ORAL | Status: DC
  Administered 2011-07-21 – 2011-07-23 (×3): 2 via ORAL

## 2011-07-21 MED ORDER — SIMETHICONE 80 MG PO CHEW: 80.0000 mg | CHEWABLE_TABLET | ORAL | Status: DC | PRN

## 2011-07-21 MED ORDER — SODIUM BICARBONATE 8.4 % IV SOLN
INTRAVENOUS | Status: DC | PRN
  Administered 2011-07-21 (×2): 5 mL via EPIDURAL

## 2011-07-21 MED ORDER — METHYLERGONOVINE MALEATE 0.2 MG PO TABS: 0.2000 mg | ORAL_TABLET | ORAL | Status: DC | PRN

## 2011-07-21 MED ORDER — METHYLERGONOVINE MALEATE 0.2 MG/ML IJ SOLN: 0.2000 mg | INTRAMUSCULAR | Status: DC | PRN

## 2011-07-21 MED ORDER — ONDANSETRON HCL 4 MG/2ML IJ SOLN
INTRAMUSCULAR | Status: AC
  Filled 2011-07-21: qty 2

## 2011-07-21 MED ORDER — SODIUM CHLORIDE 0.9 % IV SOLN
2.0000 g | Freq: Four times a day (QID) | INTRAVENOUS | Status: DC
  Administered 2011-07-21 – 2011-07-22 (×5): 2 g via INTRAVENOUS
  Filled 2011-07-21 (×6): qty 2000

## 2011-07-21 MED ORDER — FERROUS SULFATE 325 (65 FE) MG PO TABS
325.0000 mg | ORAL_TABLET | Freq: Two times a day (BID) | ORAL | Status: DC
  Administered 2011-07-21 – 2011-07-22 (×3): 325 mg via ORAL
  Filled 2011-07-21 (×6): qty 1

## 2011-07-21 MED ORDER — ZOLPIDEM TARTRATE 5 MG PO TABS: 5.0000 mg | ORAL_TABLET | Freq: Every evening | ORAL | Status: DC | PRN

## 2011-07-21 MED ORDER — LIDOCAINE-EPINEPHRINE (PF) 2 %-1:200000 IJ SOLN
INTRAMUSCULAR | Status: AC
  Filled 2011-07-21: qty 20

## 2011-07-21 MED ORDER — SODIUM CHLORIDE 0.9 % IV SOLN: 250.0000 mL | INTRAVENOUS | Status: DC

## 2011-07-21 MED ORDER — PHENYLEPHRINE HCL 10 MG/ML IJ SOLN
INTRAMUSCULAR | Status: DC | PRN
  Administered 2011-07-21 (×2): 80 ug via INTRAVENOUS

## 2011-07-21 MED ORDER — MORPHINE SULFATE 0.5 MG/ML IJ SOLN
INTRAMUSCULAR | Status: AC
  Filled 2011-07-21: qty 10

## 2011-07-21 MED ORDER — NALBUPHINE HCL 10 MG/ML IJ SOLN
5.0000 mg | INTRAMUSCULAR | Status: DC | PRN
  Filled 2011-07-21: qty 1

## 2011-07-21 MED ORDER — KETOROLAC TROMETHAMINE 60 MG/2ML IM SOLN
INTRAMUSCULAR | Status: AC
  Filled 2011-07-21: qty 2

## 2011-07-21 MED ORDER — IBUPROFEN 600 MG PO TABS
600.0000 mg | ORAL_TABLET | Freq: Four times a day (QID) | ORAL | Status: DC | PRN
  Filled 2011-07-21 (×10): qty 1

## 2011-07-21 MED ORDER — MIDAZOLAM HCL 2 MG/2ML IJ SOLN
INTRAMUSCULAR | Status: AC
  Filled 2011-07-21: qty 2

## 2011-07-21 MED ORDER — OXYTOCIN 20 UNITS IN LACTATED RINGERS INFUSION - SIMPLE
125.0000 mL/h | INTRAVENOUS | Status: AC
  Administered 2011-07-21: 125 mL/h via INTRAVENOUS
  Filled 2011-07-21: qty 1000

## 2011-07-21 MED ORDER — GENTAMICIN SULFATE 40 MG/ML IJ SOLN
Freq: Three times a day (TID) | INTRAVENOUS | Status: DC
  Administered 2011-07-21 – 2011-07-22 (×3): via INTRAVENOUS
  Filled 2011-07-21 (×5): qty 2.5

## 2011-07-21 MED ORDER — TETANUS-DIPHTH-ACELL PERTUSSIS 5-2.5-18.5 LF-MCG/0.5 IM SUSP
0.5000 mL | Freq: Once | INTRAMUSCULAR | Status: AC
  Administered 2011-07-22: 0.5 mL via INTRAMUSCULAR
  Filled 2011-07-21 (×3): qty 0.5

## 2011-07-21 MED ORDER — PRENATAL PLUS 27-1 MG PO TABS
1.0000 | ORAL_TABLET | Freq: Every day | ORAL | Status: DC
  Administered 2011-07-21: 1 via ORAL
  Filled 2011-07-21 (×4): qty 1

## 2011-07-21 MED ORDER — SODIUM CHLORIDE 0.9 % IJ SOLN: 3.0000 mL | INTRAMUSCULAR | Status: DC | PRN

## 2011-07-21 MED ORDER — SODIUM CHLORIDE 0.9 % IJ SOLN: 3.0000 mL | Freq: Two times a day (BID) | INTRAMUSCULAR | Status: DC

## 2011-07-21 MED ORDER — SODIUM BICARBONATE 8.4 % IV SOLN
INTRAVENOUS | Status: AC
  Filled 2011-07-21: qty 50

## 2011-07-21 MED ORDER — ONDANSETRON HCL 4 MG/2ML IJ SOLN: 4.0000 mg | Freq: Once | INTRAMUSCULAR | Status: DC | PRN

## 2011-07-21 MED ORDER — LACTATED RINGERS IV SOLN
INTRAVENOUS | Status: DC | PRN
  Administered 2011-07-21: 05:00:00 via INTRAVENOUS

## 2011-07-21 MED ORDER — OXYTOCIN 10 UNIT/ML IJ SOLN
INTRAMUSCULAR | Status: AC
  Filled 2011-07-21: qty 4

## 2011-07-21 MED ORDER — MEPERIDINE HCL 25 MG/ML IJ SOLN
INTRAMUSCULAR | Status: AC
  Filled 2011-07-21: qty 2

## 2011-07-21 MED ORDER — MEPERIDINE HCL 25 MG/ML IJ SOLN
6.2500 mg | INTRAMUSCULAR | Status: DC | PRN
  Administered 2011-07-21: 12.5 mg via INTRAVENOUS

## 2011-07-21 MED ORDER — MENTHOL 3 MG MT LOZG
1.0000 | LOZENGE | OROMUCOSAL | Status: DC | PRN
  Filled 2011-07-21: qty 9

## 2011-07-21 MED ORDER — MORPHINE SULFATE (PF) 0.5 MG/ML IJ SOLN
INTRAMUSCULAR | Status: DC | PRN
  Administered 2011-07-21: 4 mg via EPIDURAL

## 2011-07-21 MED ORDER — METOCLOPRAMIDE HCL 5 MG/ML IJ SOLN: 10.0000 mg | Freq: Three times a day (TID) | INTRAMUSCULAR | Status: DC | PRN

## 2011-07-21 MED ORDER — PRENATAL PLUS 27-1 MG PO TABS
1.0000 | ORAL_TABLET | Freq: Every day | ORAL | Status: DC
  Administered 2011-07-21 – 2011-07-24 (×4): 1 via ORAL
  Filled 2011-07-21 (×4): qty 1

## 2011-07-21 MED ORDER — OXYTOCIN 20 UNITS IN LACTATED RINGERS INFUSION - SIMPLE
INTRAVENOUS | Status: DC | PRN
  Administered 2011-07-21: 40 [IU] via INTRAVENOUS
  Administered 2011-07-21: 20 [IU] via INTRAVENOUS

## 2011-07-21 MED ORDER — ONDANSETRON HCL 4 MG/2ML IJ SOLN: 4.0000 mg | INTRAMUSCULAR | Status: DC | PRN

## 2011-07-21 MED ORDER — NALOXONE HCL 0.4 MG/ML IJ SOLN: 0.4000 mg | INTRAMUSCULAR | Status: DC | PRN

## 2011-07-21 MED ORDER — KETOROLAC TROMETHAMINE 60 MG/2ML IM SOLN
60.0000 mg | Freq: Once | INTRAMUSCULAR | Status: AC | PRN
  Administered 2011-07-21: 60 mg via INTRAMUSCULAR

## 2011-07-21 MED ORDER — LORATADINE 10 MG PO TABS
10.0000 mg | ORAL_TABLET | Freq: Every evening | ORAL | Status: DC
  Administered 2011-07-21 – 2011-07-23 (×3): 10 mg via ORAL
  Filled 2011-07-21 (×4): qty 1

## 2011-07-21 MED ORDER — SCOPOLAMINE 1 MG/3DAYS TD PT72
1.0000 | MEDICATED_PATCH | Freq: Once | TRANSDERMAL | Status: AC
  Administered 2011-07-21: 1.5 mg via TRANSDERMAL

## 2011-07-21 MED ORDER — FENTANYL CITRATE 0.05 MG/ML IJ SOLN
INTRAMUSCULAR | Status: AC
  Filled 2011-07-21: qty 2

## 2011-07-21 MED ORDER — SODIUM CHLORIDE 0.9 % IV SOLN
1.0000 ug/kg/h | INTRAVENOUS | Status: DC | PRN
  Filled 2011-07-21: qty 2.5

## 2011-07-21 MED ORDER — BISACODYL 10 MG RE SUPP
10.0000 mg | Freq: Every day | RECTAL | Status: DC | PRN
  Filled 2011-07-21: qty 1

## 2011-07-21 MED ORDER — OXYTOCIN 10 UNIT/ML IJ SOLN
INTRAMUSCULAR | Status: AC
  Filled 2011-07-21: qty 2

## 2011-07-21 MED ORDER — HYDROMORPHONE HCL 1 MG/ML IJ SOLN
INTRAMUSCULAR | Status: AC
  Filled 2011-07-21: qty 1

## 2011-07-21 MED ORDER — SCOPOLAMINE 1 MG/3DAYS TD PT72
MEDICATED_PATCH | TRANSDERMAL | Status: AC
  Administered 2011-07-21: 1.5 mg via TRANSDERMAL
  Filled 2011-07-21: qty 1

## 2011-07-21 MED ORDER — HYDROMORPHONE HCL 1 MG/ML IJ SOLN
0.2500 mg | INTRAMUSCULAR | Status: DC | PRN
  Administered 2011-07-21: 0.5 mg via INTRAVENOUS

## 2011-07-21 MED ORDER — MEASLES, MUMPS & RUBELLA VAC ~~LOC~~ INJ
0.5000 mL | INJECTION | Freq: Once | SUBCUTANEOUS | Status: DC
  Filled 2011-07-21: qty 0.5

## 2011-07-21 MED ORDER — FLEET ENEMA 7-19 GM/118ML RE ENEM: 1.0000 | ENEMA | RECTAL | Status: DC | PRN

## 2011-07-21 MED ORDER — KETOROLAC TROMETHAMINE 30 MG/ML IJ SOLN: 30.0000 mg | Freq: Four times a day (QID) | INTRAMUSCULAR | Status: AC | PRN

## 2011-07-21 MED ORDER — DIPHENHYDRAMINE HCL 25 MG PO CAPS
25.0000 mg | ORAL_CAPSULE | Freq: Four times a day (QID) | ORAL | Status: DC | PRN
  Filled 2011-07-21: qty 1

## 2011-07-21 MED ORDER — ONDANSETRON HCL 4 MG PO TABS: 4.0000 mg | ORAL_TABLET | ORAL | Status: DC | PRN

## 2011-07-21 MED ORDER — MEPERIDINE HCL 25 MG/ML IJ SOLN: 6.2500 mg | INTRAMUSCULAR | Status: DC | PRN

## 2011-07-21 MED ORDER — WITCH HAZEL-GLYCERIN EX PADS: 1.0000 "application " | MEDICATED_PAD | CUTANEOUS | Status: DC | PRN

## 2011-07-21 MED ORDER — OXYCODONE-ACETAMINOPHEN 5-325 MG PO TABS
1.0000 | ORAL_TABLET | ORAL | Status: DC | PRN
  Administered 2011-07-21 – 2011-07-22 (×3): 1 via ORAL
  Administered 2011-07-22: 2 via ORAL
  Administered 2011-07-22 – 2011-07-23 (×2): 1 via ORAL
  Administered 2011-07-24: 2 via ORAL
  Filled 2011-07-21 (×3): qty 1
  Filled 2011-07-21: qty 2
  Filled 2011-07-21: qty 1
  Filled 2011-07-21: qty 2
  Filled 2011-07-21: qty 1

## 2011-07-21 MED ORDER — ONDANSETRON HCL 4 MG/2ML IJ SOLN
INTRAMUSCULAR | Status: DC | PRN
  Administered 2011-07-21: 4 mg via INTRAVENOUS

## 2011-07-21 MED ORDER — DIPHENHYDRAMINE HCL 25 MG PO CAPS
25.0000 mg | ORAL_CAPSULE | ORAL | Status: DC | PRN
  Filled 2011-07-21: qty 1

## 2011-07-21 MED ORDER — SODIUM CHLORIDE 0.9 % IJ SOLN
3.0000 mL | INTRAMUSCULAR | Status: DC | PRN
  Administered 2011-07-21 – 2011-07-22 (×2): 3 mL via INTRAVENOUS

## 2011-07-21 MED ORDER — MEPERIDINE HCL 25 MG/ML IJ SOLN
INTRAMUSCULAR | Status: AC
  Filled 2011-07-21: qty 1

## 2011-07-21 MED ORDER — DIPHENHYDRAMINE HCL 50 MG/ML IJ SOLN: 12.5000 mg | INTRAMUSCULAR | Status: DC | PRN

## 2011-07-21 MED ORDER — DIPHENHYDRAMINE HCL 50 MG/ML IJ SOLN: 25.0000 mg | INTRAMUSCULAR | Status: DC | PRN

## 2011-07-21 MED ORDER — IBUPROFEN 600 MG PO TABS
600.0000 mg | ORAL_TABLET | Freq: Four times a day (QID) | ORAL | Status: DC
  Administered 2011-07-21 – 2011-07-24 (×13): 600 mg via ORAL
  Filled 2011-07-21 (×2): qty 1

## 2011-07-21 MED ORDER — LORATADINE 10 MG PO TABS
5.0000 mg | ORAL_TABLET | Freq: Every evening | ORAL | Status: DC
  Filled 2011-07-21: qty 0.5

## 2011-07-21 MED ORDER — SIMETHICONE 80 MG PO CHEW
80.0000 mg | CHEWABLE_TABLET | Freq: Three times a day (TID) | ORAL | Status: DC
  Administered 2011-07-21 – 2011-07-24 (×10): 80 mg via ORAL

## 2011-07-21 MED ORDER — DIBUCAINE 1 % RE OINT
1.0000 "application " | TOPICAL_OINTMENT | RECTAL | Status: DC | PRN
  Filled 2011-07-21: qty 28

## 2011-07-21 MED ORDER — LANOLIN HYDROUS EX OINT: 1.0000 "application " | TOPICAL_OINTMENT | CUTANEOUS | Status: DC | PRN

## 2011-07-21 MED ORDER — MIDAZOLAM HCL 5 MG/5ML IJ SOLN
INTRAMUSCULAR | Status: DC | PRN
  Administered 2011-07-21: 1 mg via INTRAVENOUS

## 2011-07-21 MED ORDER — MEPERIDINE HCL 25 MG/ML IJ SOLN
INTRAMUSCULAR | Status: DC | PRN
  Administered 2011-07-21 (×3): 6 mg via INTRAVENOUS
  Administered 2011-07-21: 7 mg via INTRAVENOUS

## 2011-07-21 MED ORDER — BUPIVACAINE HCL (PF) 0.25 % IJ SOLN
INTRAMUSCULAR | Status: DC | PRN
  Administered 2011-07-21: 7 mL

## 2011-07-21 MED ORDER — MORPHINE SULFATE (PF) 0.5 MG/ML IJ SOLN
INTRAMUSCULAR | Status: DC | PRN
  Administered 2011-07-21: 1 mg via INTRAVENOUS

## 2011-07-21 MED ORDER — GENTAMICIN SULFATE 40 MG/ML IJ SOLN
INTRAMUSCULAR | Status: DC | PRN
  Administered 2011-07-21: 108 mL via INTRAVENOUS

## 2011-07-21 MED ORDER — ONDANSETRON HCL 4 MG/2ML IJ SOLN: 4.0000 mg | Freq: Three times a day (TID) | INTRAMUSCULAR | Status: DC | PRN

## 2011-07-21 MED ORDER — MEDROXYPROGESTERONE ACETATE 150 MG/ML IM SUSP: 150.0000 mg | INTRAMUSCULAR | Status: DC | PRN

## 2011-07-21 SURGICAL SUPPLY — 39 items
BENZOIN TINCTURE PRP APPL 2/3 (GAUZE/BANDAGES/DRESSINGS) IMPLANT
CLOTH BEACON ORANGE TIMEOUT ST (SAFETY) ×2 IMPLANT
CONTAINER PREFILL 10% NBF 15ML (MISCELLANEOUS) IMPLANT
DRESSING TELFA 8X3 (GAUZE/BANDAGES/DRESSINGS) ×2 IMPLANT
DRSG COVADERM 4X8 (GAUZE/BANDAGES/DRESSINGS) ×2 IMPLANT
ELECT REM PT RETURN 9FT ADLT (ELECTROSURGICAL) ×2
ELECTRODE REM PT RTRN 9FT ADLT (ELECTROSURGICAL) ×1 IMPLANT
EXTRACTOR VACUUM KIWI (MISCELLANEOUS) IMPLANT
EXTRACTOR VACUUM M CUP 4 TUBE (SUCTIONS) IMPLANT
GAUZE SPONGE 4X4 12PLY STRL LF (GAUZE/BANDAGES/DRESSINGS) ×4 IMPLANT
GLOVE BIO SURGEON STRL SZ 6.5 (GLOVE) ×2 IMPLANT
GLOVE BIOGEL PI IND STRL 7.0 (GLOVE) ×2 IMPLANT
GLOVE BIOGEL PI INDICATOR 7.0 (GLOVE) ×2
GOWN PREVENTION PLUS LG XLONG (DISPOSABLE) ×6 IMPLANT
KIT ABG SYR 3ML LUER SLIP (SYRINGE) IMPLANT
NEEDLE HYPO 25X1 1.5 SAFETY (NEEDLE) ×2 IMPLANT
NEEDLE HYPO 25X5/8 SAFETYGLIDE (NEEDLE) IMPLANT
NS IRRIG 1000ML POUR BTL (IV SOLUTION) ×2 IMPLANT
PACK C SECTION WH (CUSTOM PROCEDURE TRAY) ×2 IMPLANT
PAD ABD 7.5X8 STRL (GAUZE/BANDAGES/DRESSINGS) IMPLANT
RTRCTR C-SECT PINK 25CM LRG (MISCELLANEOUS) ×2 IMPLANT
SLEEVE SCD COMPRESS KNEE MED (MISCELLANEOUS) ×2 IMPLANT
STAPLER VISISTAT 35W (STAPLE) ×2 IMPLANT
STRIP CLOSURE SKIN 1/2X4 (GAUZE/BANDAGES/DRESSINGS) IMPLANT
SUT CHROMIC GUT AB #0 18 (SUTURE) IMPLANT
SUT MNCRL 0 VIOLET CTX 36 (SUTURE) ×3 IMPLANT
SUT MON AB 4-0 PS1 27 (SUTURE) IMPLANT
SUT MONOCRYL 0 CTX 36 (SUTURE) ×6
SUT PLAIN 2 0 (SUTURE) ×2
SUT PLAIN ABS 2-0 CT1 27XMFL (SUTURE) ×1 IMPLANT
SUT VIC AB 0 CT1 27 (SUTURE) ×4
SUT VIC AB 0 CT1 27XBRD ANBCTR (SUTURE) ×2 IMPLANT
SUT VIC AB 2-0 CT1 27 (SUTURE) ×2
SUT VIC AB 2-0 CT1 TAPERPNT 27 (SUTURE) ×1 IMPLANT
SUT VICRYL 0 TIES 12 18 (SUTURE) IMPLANT
SYR CONTROL 10ML LL (SYRINGE) ×2 IMPLANT
TOWEL OR 17X24 6PK STRL BLUE (TOWEL DISPOSABLE) ×4 IMPLANT
TRAY FOLEY CATH 14FR (SET/KITS/TRAYS/PACK) IMPLANT
WATER STERILE IRR 1000ML POUR (IV SOLUTION) ×2 IMPLANT

## 2011-07-21 NOTE — Progress Notes (Signed)
Encounter addended by: Karleen Dolphin on: 07/21/2011  5:36 PM<BR>     Documentation filed: Notes Section

## 2011-07-21 NOTE — Progress Notes (Signed)
Sabrina [name] is a 30 y.o. G1P0 at [redacted]w[redacted]d by ultrasound admitted for [facility]  Subjective: Epidural c/o pain Chief Complaint  Patient presents with  . Hypertension   Previous LEEP procedure Objective: BP 138/69  Pulse 80  Temp(Src) 99.3 F (37.4 C) (Oral)  Resp 18  Ht 5\' 9"  (1.753 m)  Wt 231 lb 4.8 oz (104.917 kg)  BMI 34.16 kg/m2 I/O last 3 completed shifts: In: 1109.6 [P.O.:370; I.V.:739.6] Out: 3260 [Urine:3260]  24 hr UTO  FHT:  FHR: 150 bpm, variability: moderate,  accelerations:  Present,  decelerations:  Absent UC:   regular, every 2 minutes SVE:   3-4 cm dilated, 100% effaced, -1 station, tight scarring of cervix w/ distal edema Tracing: reactive. MVU up to 360  Labs: Lab Results  Component Value Date   WBC 9.7 07/21/2011   HGB 10.1* 07/21/2011   HCT 30.3* 07/21/2011   MCV 83.5 07/21/2011   PLT 255 07/21/2011    Assessment / Plan: Arrest in active phase of labor Presumed Chorioamnionitis on Unasyn PIH  Anticipated MOD:  C/S Unable to release scar tissue. Effective labor for hours w/o change, will proceed with Prim C/S. Procedure explained. Risk of surgery reviewed including but not limited to infection, bleeding, injury to bladder,bowel, ureter, internal scar tissue, poss blood transfusion. All ? Answered Will add  IV Clindamycin and Gentamicin  Sabrina [name] A 07/21/2011, 3:34 AM

## 2011-07-21 NOTE — Anesthesia Postprocedure Evaluation (Signed)
Anesthesia Post Note  Patient: Sabrina [name]  Procedure(s) Performed:  CESAREAN SECTION  Anesthesia type: Epidurall  Patient location: PACU  Post pain: Pain level controlled  Post assessment: Post-op Vital signs reviewed  Last Vitals:  Filed Vitals:   07/21/11 0515  BP: 154/93  Pulse: 105  Temp:   Resp: 19    Post vital signs: Reviewed  Level of consciousness: awake  Complications: No apparent anesthesia complications

## 2011-07-21 NOTE — Transfer of Care (Signed)
Immediate Anesthesia Transfer of Care Note  Patient: Sabrina [name]  Procedure(s) Performed:  CESAREAN SECTION  Patient Location: PACU  Anesthesia Type: Epidural  Level of Consciousness: awake and patient cooperative  Airway & Oxygen Therapy: Patient Spontanous Breathing  Post-op Assessment: Report given to PACU RN and Post -op Vital signs reviewed and stable  Post vital signs: Reviewed and stable  Complications: No apparent anesthesia complications

## 2011-07-21 NOTE — Brief Op Note (Signed)
07/21/2011  5:42 AM  PATIENT:  Sabrina [name]  30 y.o. female  PRE-OPERATIVE DIAGNOSIS:  Arrest Dilation,  Presumed chorioamnionitis, PIH, .Intrauterine Pregnancy At 37 3/7 Weeks,  POST-OPERATIVE DIAGNOSIS:  Same; Status Post Primary C-Section; Female At 81; Apgar;8/9  PROCEDURE:  Procedure(s): Primary  CESAREAN SECTION, kerr hysterotomy  SURGEON:  Surgeon(s): Mickeal Daws A Glenwood Revoir, MD  PHYSICIAN ASSISTANT:   ASSISTANTS: none  Findings; live female LOP nl ovaries, right tube nl, left peritubal adhesions  ANESTHESIA:   epidural  EBL:  Total I/O In: 1600 [I.V.:1600] Out: 1000 [Urine:400; Blood:600]  BLOOD ADMINISTERED:none  DRAINS: none   LOCAL MEDICATIONS USED:  MARCAINE 7CC  SPECIMEN:  Source of Specimen:  placenta  DISPOSITION OF SPECIMEN:  PATHOLOGY  COUNTS:  YES  TOURNIQUET:  * No tourniquets in log *  DICTATION: .Other Dictation: Dictation Number [id number]  PLAN OF CARE: Admit to inpatient   PATIENT DISPOSITION:  PACU - hemodynamically stable.   Delay start of Pharmacological VTE agent (>24hrs) due to surgical blood loss or risk of bleeding:  no

## 2011-07-21 NOTE — Op Note (Signed)
Sabrina [name], FONTE                ACCOUNT NO.:  [id number]  MEDICAL RECORD NO.:  [id number]  LOCATION:  9130                          FACILITY:  [facility]  PHYSICIAN:  Maxie Better, M.D.DATE OF BIRTH:  09-Apr-1981  DATE OF PROCEDURE:  07/21/2011 DATE OF DISCHARGE:                              OPERATIVE REPORT   PREOPERATIVE DIAGNOSES:  Intrauterine gestation at 37-3/7 weeks' gestation, arrest of dilatation, pregnancy-induced hypertension, presumed chorioamnionitis.  PROCEDURE:  Primary cesarean section, Kerr hysterotomy.  POSTOPERATIVE DIAGNOSES:  Arrest of dilatation, history of a LEEP procedure, pregnancy-induced hypertension, presumed chorioamnionitis, intrauterine gestation at 37-3/7 weeks.  ANESTHESIA:  Epidural.  SURGEON:  Maxie Better, MD  ASSISTANT:  None.  INDICATION:  This is a 30 year old, gravida 1, para 0 female at 37-3/ 7 weeks' gestation admitted on July 19, 2011 for elevated blood pressures.  The patient subsequently had induction labor due to elevation of her blood pressures.  She was subsequently diagnosed with just probably pregnancy-induced hypertension/exacerbation of chronic hypertension.  When her urine total protein came back at 231 g in a 24- hour period of time.  PIH labs also normal.  The patient was started on Procardia XL.  She had a history of a LEEP procedure and Cytotec was initially used for induction of labor followed by balloon Foley catheter which was subsequently removed, at that time, the patient had some artificial rupture of membranes, intrauterine pressure catheter was placed.  Pitocin induction/augmentation.  The patient progressed to 4 cm, 100%, -1, but remained at that dilatation despite of contraction strength up to 360 [place] units.  Due to scarring from her LEEP procedure which could not be reduced despite tactile manipulation.  The patient developed a temperature as high as 100.3 for which Unasyn was ordered but  has not been reviewed.  The patient had CBC and urine culture sent.  Decision was made after several hours without any cervical change to proceed with a primary cesarean section.  Risks of procedure was explained to the patient.  Consent was signed.  The patient was transferred to the operating room.  PROCEDURE:  Under adequate epidural anesthesia, the patient was placed in the supine position of the left lateral position.  The patient was sterilely prepped and draped in usual fashion.  Indwelling Foley catheter was already in place.  The patient was prepped with ChloraPrep. A Pfannenstiel skin incision was then made carried down to the rectus fascia.  Rectus fascia was opened transversely.  Rectus fascia was then bluntly and sharply dissected off the rectus muscle in superior and inferior fashion.  The rectus muscles split in the midline.  The parietal peritoneum was entered bluntly and extended.  The vesicouterine peritoneum was opened transversely.  The bladder was bluntly dissected off the lower uterine segment displaced inferiorly with a bladder retractor.  A curvilinear low transverse uterine incision was then made and extended with bandage scissors.  Subsequent delivery of a live female from the left occiput posterior position was accomplished.  Baby was bulb suctioned in the abdomen.  Cord was clamped and cut.  The baby was transferred to the awaiting pediatrician who assigned Apgars of 8 and 9 at 1 and 5 minutes.  The placenta was spontaneously intact, sent to pathology due to maternal fever.  Uterine cavity was cleaned of debris.  Uterine incision had a small extension on the right that extension was closed separately with 0 Monocryl in a running lock stitch.  Remaining incision was closed with 0 Monocryl running locked stitch followed by a 0 Monocryl running imbricating stitch.  The abdomen was then copiously irrigated and suctioned of debris.  The right tube and ovary are  normal.  The left tube with paratubal adhesions which were lysed.  The ovary itself was normal.  The paracolic gutters were cleaned of debris.  The parietal peritoneum was closed with 2-0 Vicryl.  The rectus fascia was closed with 0 Vicryl x2.  The subcutaneous area was irrigated.  Small bleeders were cauterized.  Interrupted 2-0 plain suture was placed and the skin was approximated using Ethicon staples.  SPECIMENS:  Placenta sent to pathology.  ESTIMATED BLOOD LOSS:  600 mL.  INTRAOPERATIVE FLUIDS:  1600 mL.  URINE OUTPUT:  400 mL.  Sponge and instrument counts x2 was correct.  COMPLICATIONS:  None.  Weight of the baby was 5 pounds 10 ounces.  The patient tolerated the procedure well and was transferred to recovery room in stable condition.     Maxie Better, M.D.     [place]/[facility]  D:  07/21/2011  T:  07/21/2011  Job:  [id number]

## 2011-07-21 NOTE — Progress Notes (Signed)
Sabrina [name] is a 30 y.o. G1P0 at [redacted]w[redacted]d by ultrasound admitted for induction of labor due to [facility].  Subjective: Chief Complaint  Patient presents with  . Hypertension    Objective: BP 150/89  Pulse 80  Temp(Src) 100.3 F (37.9 C) (Oral)  Resp 18  Ht 5\' 9"  (1.753 m)  Wt 231 lb 4.8 oz (104.917 kg)  BMI 34.16 kg/m2 I/O last 3 completed shifts: In: 1109.6 [P.O.:370; I.V.:739.6] Out: 3260 [Urine:3260]    FHT:  FHR: 145 bpm, variability: moderate,  accelerations:  Present,  decelerations:  Absent UC:   irregular, every 2-4 minutes SVE:  deferred Tracing: reassuring  Labs: Lab Results  Component Value Date   WBC 10.5 07/19/2011   HGB 11.2* 07/19/2011   HCT 34.4* 07/19/2011   MCV 84.3 07/19/2011   PLT 309 07/19/2011    Assessment / Plan: PIH IUP @ 37 3/7  Active phase of labor   Anticipated MOD:  NSVD cont pitocin  Khyler Eschmann A 07/21/2011, 12:37 AM

## 2011-07-21 NOTE — Progress Notes (Signed)
  INTERVAL NOTE:  S:  Resting in bed, sleepy/tired, pain well controlled, infant at [facility], denies N/V Fresh pos-op  O:  BP 152/85  Pulse 93  Temp(Src) 99.3 F (37.4 C) (Oral)  Resp 23  Ht 5\' 9"  (1.753 m)  Wt 104.917 kg (231 lb 4.8 oz)  BMI 34.16 kg/m2  SpO2 98%  Breastfeeding   AAO x 3, NAD  FF bellow U  Scant lochia  A / P:   POD #0;  P C/S for arrest of dilation  Gest HTN, stable PIH labs, BP stable  Stable post op  Routine PP orders  PAUL,DANIELA, CNM, MSN  07/21/2011 8:24 AM

## 2011-07-21 NOTE — Anesthesia Postprocedure Evaluation (Signed)
  Anesthesia Post-op Note  Patient: Sabrina [name]  Procedure(s) Performed:  CESAREAN SECTION  Patient Location: 130  Anesthesia Type: Epidural  Level of Consciousness: awake, alert  and oriented  Airway and Oxygen Therapy: Patient Spontanous Breathing  Post-op Pain: mild  Post-op Assessment: Post-op Vital signs reviewed and Patient's Cardiovascular Status Stable  Post-op Vital Signs: Reviewed and stable  Complications: No apparent anesthesia complications

## 2011-07-22 ENCOUNTER — Encounter (HOSPITAL_COMMUNITY): Payer: Self-pay

## 2011-07-22 DIAGNOSIS — D509 Iron deficiency anemia, unspecified: Secondary | ICD-10-CM

## 2011-07-22 HISTORY — DX: Anemia complicating pregnancy, unspecified trimester: D50.9

## 2011-07-22 LAB — URINE CULTURE
Colony Count: NO GROWTH
Culture  Setup Time: 201210110355
Culture: NO GROWTH

## 2011-07-22 LAB — CBC
HCT: 26.7 % — ABNORMAL LOW (ref 36.0–46.0)
Hemoglobin: 8.9 g/dL — ABNORMAL LOW (ref 12.0–15.0)
MCH: 28 pg (ref 26.0–34.0)
MCHC: 33.3 g/dL (ref 30.0–36.0)
MCV: 84 fL (ref 78.0–100.0)
Platelets: 214 10*3/uL (ref 150–400)
RBC: 3.18 MIL/uL — ABNORMAL LOW (ref 3.87–5.11)
RDW: 14.1 % (ref 11.5–15.5)
WBC: 9.1 10*3/uL (ref 4.0–10.5)

## 2011-07-22 MED ORDER — POLYSACCHARIDE IRON 150 MG PO CAPS
150.0000 mg | ORAL_CAPSULE | Freq: Every day | ORAL | Status: DC
  Administered 2011-07-22 – 2011-07-24 (×3): 150 mg via ORAL
  Filled 2011-07-22 (×4): qty 1

## 2011-07-22 MED ORDER — DOCUSATE SODIUM 100 MG PO CAPS
100.0000 mg | ORAL_CAPSULE | Freq: Every day | ORAL | Status: DC
  Administered 2011-07-22 – 2011-07-24 (×2): 100 mg via ORAL
  Filled 2011-07-22 (×2): qty 1

## 2011-07-22 NOTE — Progress Notes (Signed)
  Subjective: POD# 1 Information for the patient's newborn:  Sabrina [name], Sabrina [name] [[id number]]  female   Reports feeling well, sore but tolerable. Infant admitted to NICU for hypoglycemia. Feeding: breast, pumping Patient reports tolerating PO.  Breast symptoms: none Pain controlled withprescription NSAID's including motrin and narcotic analgesics including percocet Denies HA/SOB/C/P/N/V/dizziness. Flatus present. She reports vaginal bleeding as normal, without clots.  She is ambulating, urinating without difficult.    On IV ABX for past 24 hours for suspect chorio in labor with maternal temp.  Objective:   Temp:  [97.8 F (36.6 C)-98.8 F (37.1 C)] 97.8 F (36.6 C) (10/12 0500) Pulse Rate:  [69-107] 84  (10/12 0500) Resp:  [17-19] 18  (10/12 0500) BP: (124-154)/(70-92) 128/79 mmHg (10/12 0500) SpO2:  [97 %-98 %] 97 % (10/12 0500)    Intake/Output Summary (Last 24 hours) at 07/22/11 0857 Last data filed at 07/22/11 0200  Gross per 24 hour  Intake 4067.73 ml  Output   3550 ml  Net 517.73 ml     Basename 07/22/11 0550 07/21/11 0026  WBC 9.1 9.7  HGB 8.9* 10.1*  HCT 26.7* 30.3*  PLT 214 255     Blood type: --/--/O POS (10/11 0000)  Rubella: Immune (03/27 0000)     Physical Exam:  General: alert, cooperative and no distress CV: Regular rate and rhythm Resp: clear Abdomen: soft, nontender, normal bowel sounds Incision: clean, dry and dressing in place Uterine Fundus: firm, below umbilicus, nontender Lochia: minimal Ext: edema +1 pedal and Homans sign is negative, no sign of DVT      Assessment/Plan: 30 y.o.  status post Cesarean section. POD# 1.  s/p Cesarean Delivery.  Indications: failure to progress      S/P IOL for PIH   Hx cHTN, no evidence PEC Principal Problem:  *Postpartum care following cesarean delivery (10/11)\  Doing well, stable.  Active Problems:  Gestational hypertension  Continue Nifedipine 30 mg XL daily  Maternal iron deficiency anemia  complicating pregnancy  Started oral Fe and stool softener  Suspect chorio in labor with maternal temp, no evidence of endometritis in past 24 hrs, T-max 98.8  D/C IV ABX              Advance diet as tolerated Ambulate Routine post-op care D/C IV and dressing, shower.  Sabrina [name] 07/22/2011, 8:54 AM

## 2011-07-23 MED ORDER — DM-GUAIFENESIN ER 30-600 MG PO TB12
1.0000 | ORAL_TABLET | Freq: Two times a day (BID) | ORAL | Status: DC | PRN
  Administered 2011-07-23: 1 via ORAL
  Filled 2011-07-23: qty 1

## 2011-07-23 MED ORDER — SALINE SPRAY 0.65 % NA SOLN
1.0000 | NASAL | Status: DC | PRN
  Administered 2011-07-23: 1 via NASAL
  Filled 2011-07-23: qty 44

## 2011-07-23 NOTE — Progress Notes (Signed)
  Subjective: POD# 2 Information for the patient's newborn:  Sabrina [name], Sabrina [name] [[id number]]  female   Reports feeling well. Nasal stuffiness since last couple of days. Feeding: breast Patient reports tolerating PO.  Breast symptoms: colostrum (+) Pain controlled withibuprofen (OTC) and percocet Denies HA/SOB/C/P/N/V/dizziness. Flatus present. She reports vaginal bleeding as normal, without clots.  She is ambulating, urinating without difficult.    Infant stable in NICU, breastfeeding, mother pumping in between.  Objective:   VS: Blood pressure 137/87, pulse 91, temperature 98.6 F (37 C), temperature source Oral, resp. rate 18, height 5\' 9"  (1.753 m), weight 104.917 kg (231 lb 4.8 oz), SpO2 97.00%, unknown if currently breastfeeding.  No intake or output data in the 24 hours ending 07/23/11 1035      Basename 07/22/11 0550 07/21/11 0026  WBC 9.1 9.7  HGB 8.9* 10.1*  HCT 26.7* 30.3*  PLT 214 255     Blood type: --/--/O POS (10/11 0000)  Rubella: Immune (03/27 0000)     Physical Exam:  General: alert, cooperative and no distress CV: Regular rate and rhythm Resp: clear Abdomen: soft, nontender, normal bowel sounds Incision: clean, dry, intact and staples in place, no erythema or ecchymosis Uterine Fundus: firm, below umbilicus, non-tender Lochia: minimal Ext: edema +1 pedal      Assessment/Plan: 30 y.o.  status post Cesarean section. POD# 2.  s/p Cesarean Delivery.  Indications: failure to progress                Principal Problem:  *Postpartum care following cesarean delivery (10/11) Active Problems:  Gestational hypertension  Maternal iron deficiency anemia complicating pregnancy  Continue oral Fe and colace Doing well, stable.    Routine post-op care Viral URI vs allergic rhinitis  tx w/ Mucinex and saline drops. Anticipate discharge home in AM.    PAUL,DANIELA 07/23/2011, 10:35 AM

## 2011-07-24 ENCOUNTER — Encounter (HOSPITAL_COMMUNITY): Payer: Self-pay | Admitting: Obstetrics and Gynecology

## 2011-07-24 MED ORDER — OXYCODONE-ACETAMINOPHEN 5-325 MG PO TABS: 1.0000 | ORAL_TABLET | ORAL | Status: AC | PRN

## 2011-07-24 MED ORDER — POLYSACCHARIDE IRON 150 MG PO CAPS: 150.0000 mg | ORAL_CAPSULE | Freq: Every day | ORAL | Status: DC

## 2011-07-24 MED ORDER — NIFEDIPINE ER 30 MG PO TB24: 30.0000 mg | ORAL_TABLET | Freq: Every day | ORAL | Status: DC

## 2011-07-24 MED ORDER — IBUPROFEN 600 MG PO TABS: 600.0000 mg | ORAL_TABLET | Freq: Four times a day (QID) | ORAL | Status: AC

## 2011-07-24 MED ORDER — DSS 100 MG PO CAPS: 100.0000 mg | ORAL_CAPSULE | Freq: Every day | ORAL | Status: AC

## 2011-07-24 MED ORDER — DM-GUAIFENESIN ER 30-600 MG PO TB12: 1.0000 | ORAL_TABLET | Freq: Two times a day (BID) | ORAL | Status: AC | PRN

## 2011-07-24 MED ORDER — INFLUENZA VIRUS VACC SPLIT PF IM SUSP
0.5000 mL | Freq: Once | INTRAMUSCULAR | Status: AC
  Administered 2011-07-24: 0.5 mL via INTRAMUSCULAR
  Filled 2011-07-24: qty 0.5

## 2011-07-24 NOTE — Discharge Summary (Signed)
Patient ID: Sabrina [name] MRN: [id number] DOB/AGE: Mar 02, 1981 30 y.o.  Admit date: 07/19/2011 Discharge date:  07/24/2011   Admission Diagnoses: 37 WKS pregnancy Gestational hypertension  Discharge Diagnoses:  Principal Problem:  *Postpartum care following cesarean delivery (10/11) Active Problems:  Gestational hypertension  Maternal iron deficiency anemia complicating pregnancy   Prenatal history:  G1P1001 at [redacted]w[redacted]d weeks gestation with Estimated Date of Delivery: 08/08/11 Prenatal care at [facility] since [redacted]  weeks gestation with Dr. Cherly Hensen as primary provider.   Prenatal course complicated by chronic hypertension, Raynaud's, hx LEEP  Prenatal Labs: ABO, Rh: --/--/O POS (10/11 0000) Antibody: Negative (03/27 0000) Rubella:  immune RPR: NON REACTIVE (10/09 1315)  HBsAg: Negative (03/27 0000)  HIV: Non-reactive (03/27 0000)  GBS:   neg 1 hr Glucola : 126   Medical / Surgical History : Past Medical History  Diagnosis Date  . Hypertension   . Pregnancy induced hypertension   . Abnormal Pap smear   . Postpartum care following cesarean delivery (10/11) 07/21/2011  . Gestational hypertension 07/21/2011  . Maternal iron deficiency anemia complicating pregnancy 07/22/2011   Past Surgical History  Procedure Date  . Leep    Social History:  reports that she has never smoked. She has never used smokeless tobacco. She reports that she does not drink alcohol or use illicit drugs.  Allergies: Review of patient's allergies indicates no known allergies.   Current Medications at time of admission:  Prescriptions prior to admission  Medication Sig Dispense Refill  . levocetirizine (XYZAL) 5 MG tablet Take 5 mg by mouth every evening.        . prenatal vitamin w/FE, FA (PRENATAL 1 + 1) 27-1 MG TABS Take 1 tablet by mouth daily.        Marland Kitchen DISCONTD: acetaminophen (TYLENOL) 500 MG tablet Take 500 mg by mouth every 6 (six) hours as needed. For headache      .  DISCONTD: labetalol (NORMODYNE) 100 MG tablet Take 1 tablet (100 mg total) by mouth 2 (two) times daily.  60 tablet  4  . DISCONTD: potassium chloride SA (K-DUR,KLOR-CON) 20 MEQ tablet Take 2 tablets (40 mEq total) by mouth 2 (two) times daily.  6 tablet  0      Intrapartum Course: Patient was admitted to hospital on 07/19/2011 after office visit during which she was found to have elevated BP 140's/90's and complained of neurological symptoms of headache and visual spots. Patient also complained of hearburn and increased leg edema, was found to have 6 lbs weight gain in one week. PIH labs were normal but BP remained labile. Induction of labor was started with cervical ripening overnight, then cervical balloon and  Pitocin infusion the next morning. She underwent an amniotomy at 1900 and an IUPC was placed. Patient had a tight scar on cervix probably related to hx of LEEP. She continued on Pitocin induction, contractions were adequate. Maternal fever developed by early morning of 07/21/2011 with T-max of 100.3. Patient was started on Unasyn IV. She continued to labor with Pitocin, fetal heart tones remained reasssuring, but no further progress in cervical dilation  (3-4/100/-1 with tight scarring and distal edema). Decision was made to proceed with cesarean section at 0330 for arrest of dilation and presumption of chorioamnionitis.  Procedures: Cesarean section delivery of female newborn by Dr Cherly Hensen  See operative report for further details  Postoperative / postpartum course:  Mild acute blood loss anemia, patient was started on oral iron and stool softener. Patient  remained stable on Procardia XL and no further evidence of preeclampsia. Newborn was admitted to NICU on day 1 for hypoglycemia and started on IV fluids and antibiotics. On post-op day 3 infant had remained stable and was taken off IV fluids, monitoring continued for hypoglycemia, discharged date was yet to be determined.   Physical Exam:    VSS: Blood pressure 143/87, pulse 74, temperature 97.9 F (36.6 C), temperature source Oral, resp. rate 20, height 5\' 9"  (1.753 m), weight 104.917 kg (231 lb 4.8 oz), SpO2 99.00%, unknown if currently breastfeeding.   LABS:  Lab Results  Component Value Date   HGB 8.9* 07/22/2011                             Lab Results  Component Value Date   PLT 214 07/22/2011        Incision:  approximated with staples / no erythema / no ecchymosis / no drainage Staples: removed prior to discharge  Discharge Instructions:  Postpartum Instructions: per [facility] Booklet - given to patient  Discharged Condition: good  Diet: Regular diet with adequate water hydration.  Discharge Orders    Future Orders Please Complete By Expires   Diet - low sodium heart healthy      Discharge instructions      Comments:   [facility] instructions booklet    HIV antibody      Comments:   This external order was created through the Results Console.   Rubella antibody, IgM      Comments:   This external order was created through the Results Console.   Hepatitis B surface antigen      Comments:   This external order was created through the Results Console.   RPR      Comments:   This external order was created through the Results Console.   Antibody screen      Comments:   This external order was created through the Results Console.   ABO/Rh      Comments:   This external order was created through the Results Console.     Current Discharge Medication List    START taking these medications   Details  dextromethorphan-guaiFENesin (MUCINEX DM) 30-600 MG per 12 hr tablet Take 1 tablet by mouth 2 (two) times daily as needed.    docusate sodium 100 MG CAPS Take 100 mg by mouth daily. Qty: 10 capsule    ibuprofen (ADVIL,MOTRIN) 600 MG tablet Take 1 tablet (600 mg total) by mouth every 6 (six) hours. Qty: 60 tablet, Refills: 0    NIFEdipine (PROCARDIA-XL/ADALAT CC) 30 MG 24 hr tablet Take 1 tablet  (30 mg total) by mouth daily. Qty: 30 tablet, Refills: 0    oxyCODONE-acetaminophen (PERCOCET) 5-325 MG per tablet Take 1-2 tablets by mouth every 3 (three) hours as needed (moderate - severe pain). Qty: 30 tablet, Refills: 0    polysaccharide iron (NIFEREX) 150 MG CAPS capsule Take 1 capsule (150 mg total) by mouth daily. Qty: 30 each, Refills: 3      CONTINUE these medications which have NOT CHANGED   Details  levocetirizine (XYZAL) 5 MG tablet Take 5 mg by mouth every evening.      prenatal vitamin w/FE, FA (PRENATAL 1 + 1) 27-1 MG TABS Take 1 tablet by mouth daily.        STOP taking these medications     acetaminophen (TYLENOL) 500 MG tablet  labetalol (NORMODYNE) 100 MG tablet      potassium chloride SA (K-DUR,KLOR-CON) 20 MEQ tablet        Follow-up Information    Follow up with COUSINS,SHERONETTE A, MD in 6 weeks.   Contact information:   [address] [place] [address] [phone]          Disposition: Home or Self Care  Signed: Kimberley Speece 07/24/2011, 11:09 AM

## 2011-07-24 NOTE — Progress Notes (Signed)
Subjective: POD# 3 Information for the patient's newborn:  Sabrina [name] [[id number]]  female   Reports feeling well, just stuffy nose Feeding: breast Patient reports tolerating PO.  Breast symptoms: pumping and breastfeeding Pain controlled withprescription NSAID's including motrin and narcotic analgesics including percocet Denies HA/SOB/C/P/N/V/dizziness. Flatus present. She reports vaginal bleeding as normal, without clots.  She is ambulating, urinating without difficult.     Objective:   VS: Blood pressure 143/87, pulse 74, temperature 97.9 F (36.6 C), temperature source Oral, resp. rate 20, height 5\' 9"  (1.753 m), weight 104.917 kg (231 lb 4.8 oz), SpO2 99.00%, unknown if currently breastfeeding.  No intake or output data in the 24 hours ending 07/24/11 1050      Basename 07/22/11 0550  WBC 9.1  HGB 8.9*  HCT 26.7*  PLT 214     Blood type: --/--/O POS (10/11 0000)  Rubella: Immune (03/27 0000)     Physical Exam:  General: alert, cooperative and no distress CV: Regular rate and rhythm Resp: clear Abdomen: soft, nontender, normal bowel sounds Incision: clean, dry, intact and staples in place Uterine Fundus: firm, below umbilicus, nontender Lochia: minimal Ext: edema (+)1 pedal and Homans sign is negative, no sign of DVT      Assessment/Plan: 30 y.o.  G1P1001 status post Cesarean section. POD# 3.  s/p Cesarean Delivery.  Indications: failure to progress                Principal Problem:  *Postpartum care following cesarean delivery (10/11) Active Problems:  Gestational hypertension - no evidence PEC, stable on Procardia XL  Maternal iron deficiency anemia complicating pregnancy  Continue oral Fe and colace Doing well, stable.    D/C staples, replace w/ steri strips         Routine post-op care  Infant off IV and ABX, may be D/Ced home from NICU tomorrow Plan D/C patient from in-house service, continue rooming in if bed  available   Sabrina [name] 07/24/2011, 10:50 AM

## 2013-12-13 ENCOUNTER — Encounter (HOSPITAL_COMMUNITY): Payer: Self-pay | Admitting: Emergency Medicine

## 2013-12-13 ENCOUNTER — Emergency Department (HOSPITAL_COMMUNITY): Payer: BC Managed Care – PPO

## 2013-12-13 ENCOUNTER — Emergency Department (HOSPITAL_COMMUNITY)
Admission: EM | Admit: 2013-12-13 | Discharge: 2013-12-13 | Disposition: A | Discharge status: Home / Self Care | Payer: BC Managed Care – PPO | Attending: Emergency Medicine | Admitting: Emergency Medicine

## 2013-12-13 ENCOUNTER — Emergency Department (HOSPITAL_COMMUNITY): Admission: EM | Admit: 2013-12-13 | Discharge: 2013-12-13 | Discharge status: Against Medical Advice | Payer: Self-pay

## 2013-12-13 DIAGNOSIS — R079 Chest pain, unspecified: Secondary | ICD-10-CM | POA: Insufficient documentation

## 2013-12-13 DIAGNOSIS — R059 Cough, unspecified: Secondary | ICD-10-CM | POA: Insufficient documentation

## 2013-12-13 DIAGNOSIS — G43909 Migraine, unspecified, not intractable, without status migrainosus: Secondary | ICD-10-CM | POA: Insufficient documentation

## 2013-12-13 DIAGNOSIS — R5381 Other malaise: Secondary | ICD-10-CM | POA: Insufficient documentation

## 2013-12-13 DIAGNOSIS — I10 Essential (primary) hypertension: Secondary | ICD-10-CM | POA: Insufficient documentation

## 2013-12-13 DIAGNOSIS — R519 Headache, unspecified: Secondary | ICD-10-CM

## 2013-12-13 DIAGNOSIS — Z3202 Encounter for pregnancy test, result negative: Secondary | ICD-10-CM | POA: Insufficient documentation

## 2013-12-13 DIAGNOSIS — Z862 Personal history of diseases of the blood and blood-forming organs and certain disorders involving the immune mechanism: Secondary | ICD-10-CM | POA: Insufficient documentation

## 2013-12-13 DIAGNOSIS — Z79899 Other long term (current) drug therapy: Secondary | ICD-10-CM | POA: Insufficient documentation

## 2013-12-13 DIAGNOSIS — R5383 Other fatigue: Secondary | ICD-10-CM

## 2013-12-13 DIAGNOSIS — R05 Cough: Secondary | ICD-10-CM | POA: Insufficient documentation

## 2013-12-13 DIAGNOSIS — R51 Headache: Secondary | ICD-10-CM

## 2013-12-13 LAB — CBC WITH DIFFERENTIAL/PLATELET
Basophils Absolute: 0 10*3/uL (ref 0.0–0.1)
Basophils Relative: 0 % (ref 0–1)
Eosinophils Absolute: 0.1 10*3/uL (ref 0.0–0.7)
Eosinophils Relative: 1 % (ref 0–5)
HCT: 39.8 % (ref 36.0–46.0)
Hemoglobin: 13.5 g/dL (ref 12.0–15.0)
Lymphocytes Relative: 25 % (ref 12–46)
Lymphs Abs: 2.5 10*3/uL (ref 0.7–4.0)
MCH: 28.7 pg (ref 26.0–34.0)
MCHC: 33.9 g/dL (ref 30.0–36.0)
MCV: 84.7 fL (ref 78.0–100.0)
Monocytes Absolute: 0.4 10*3/uL (ref 0.1–1.0)
Monocytes Relative: 4 % (ref 3–12)
Neutro Abs: 6.9 10*3/uL (ref 1.7–7.7)
Neutrophils Relative %: 70 % (ref 43–77)
Platelets: 285 10*3/uL (ref 150–400)
RBC: 4.7 MIL/uL (ref 3.87–5.11)
RDW: 14.3 % (ref 11.5–15.5)
WBC: 9.9 10*3/uL (ref 4.0–10.5)

## 2013-12-13 LAB — URINALYSIS, ROUTINE W REFLEX MICROSCOPIC
Bilirubin Urine: NEGATIVE
Glucose, UA: NEGATIVE mg/dL
Ketones, ur: NEGATIVE mg/dL
Leukocytes, UA: NEGATIVE
Nitrite: NEGATIVE
Protein, ur: NEGATIVE mg/dL
Specific Gravity, Urine: 1.011 (ref 1.005–1.030)
Urobilinogen, UA: 0.2 mg/dL (ref 0.0–1.0)
pH: 5.5 (ref 5.0–8.0)

## 2013-12-13 LAB — COMPREHENSIVE METABOLIC PANEL
ALT: 17 U/L (ref 0–35)
AST: 15 U/L (ref 0–37)
Albumin: 4 g/dL (ref 3.5–5.2)
Alkaline Phosphatase: 93 U/L (ref 39–117)
BUN: 10 mg/dL (ref 6–23)
CO2: 28 mEq/L (ref 19–32)
Calcium: 9.9 mg/dL (ref 8.4–10.5)
Chloride: 102 mEq/L (ref 96–112)
Creatinine, Ser: 0.74 mg/dL (ref 0.50–1.10)
GFR calc Af Amer: >90 mL/min (ref >90)
GFR calc non Af Amer: >90 mL/min (ref >90)
Glucose, Bld: 77 mg/dL (ref 70–99)
Potassium: 4.1 mEq/L (ref 3.7–5.3)
Sodium: 141 mEq/L (ref 137–147)
Total Bilirubin: 0.3 mg/dL (ref 0.3–1.2)
Total Protein: 8.3 g/dL (ref 6.0–8.3)

## 2013-12-13 LAB — URINE MICROSCOPIC-ADD ON

## 2013-12-13 LAB — PREGNANCY, URINE: Preg Test, Ur: NEGATIVE

## 2013-12-13 LAB — TROPONIN I: Troponin I: <0.3 ng/mL (ref <0.30)

## 2013-12-13 MED ORDER — DIPHENHYDRAMINE HCL 50 MG/ML IJ SOLN
25.0000 mg | Freq: Once | INTRAMUSCULAR | Status: AC
  Administered 2013-12-13: 25 mg via INTRAVENOUS
  Filled 2013-12-13: qty 1

## 2013-12-13 MED ORDER — METOCLOPRAMIDE HCL 5 MG/ML IJ SOLN
10.0000 mg | Freq: Once | INTRAMUSCULAR | Status: AC
  Administered 2013-12-13: 10 mg via INTRAVENOUS
  Filled 2013-12-13: qty 2

## 2013-12-13 MED ORDER — SODIUM CHLORIDE 0.9 % IV BOLUS (SEPSIS)
1000.0000 mL | Freq: Once | INTRAVENOUS | Status: AC
  Administered 2013-12-13: 1000 mL via INTRAVENOUS

## 2013-12-13 NOTE — ED Notes (Signed)
Patient transported to X-ray 

## 2013-12-13 NOTE — Discharge Instructions (Signed)
Continue to take your blood pressure medications as directed  Return to the emergency department if you develop any changing/worsening condition, chest pain, difficulty breathing, vision changes, fever, stiff neck, repeated vomiting, weakness, feeling lightheaded, or any other concerns (please read additional information regarding your condition below)     Hypertension As your heart beats, it forces blood through your arteries. This force is your blood pressure. If the pressure is too high, it is called hypertension (HTN) or high blood pressure. HTN is dangerous because you may have it and not know it. High blood pressure may mean that your heart has to work harder to pump blood. Your arteries may be narrow or stiff. The extra work puts you at risk for heart disease, stroke, and other problems.  Blood pressure consists of two numbers, a higher number over a lower, 110/72, for example. It is stated as "110 over 72." The ideal is below 120 for the top number (systolic) and under 80 for the bottom (diastolic). Write down your blood pressure today. You should pay close attention to your blood pressure if you have certain conditions such as:  Heart failure.  Prior heart attack.  Diabetes  Chronic kidney disease.  Prior stroke.  Multiple risk factors for heart disease. To see if you have HTN, your blood pressure should be measured while you are seated with your arm held at the level of the heart. It should be measured at least twice. A one-time elevated blood pressure reading (especially in the Emergency Department) does not mean that you need treatment. There may be conditions in which the blood pressure is different between your right and left arms. It is important to see your caregiver soon for a recheck. Most people have essential hypertension which means that there is not a specific cause. This type of high blood pressure may be lowered by changing lifestyle factors such  as:  Stress.  Smoking.  Lack of exercise.  Excessive weight.  Drug/tobacco/alcohol use.  Eating less salt. Most people do not have symptoms from high blood pressure until it has caused damage to the body. Effective treatment can often prevent, delay or reduce that damage. TREATMENT  When a cause has been identified, treatment for high blood pressure is directed at the cause. There are a large number of medications to treat HTN. These fall into several categories, and your caregiver will help you select the medicines that are best for you. Medications may have side effects. You should review side effects with your caregiver. If your blood pressure stays high after you have made lifestyle changes or started on medicines,   Your medication(s) may need to be changed.  Other problems may need to be addressed.  Be certain you understand your prescriptions, and know how and when to take your medicine.  Be sure to follow up with your caregiver within the time frame advised (usually within two weeks) to have your blood pressure rechecked and to review your medications.  If you are taking more than one medicine to lower your blood pressure, make sure you know how and at what times they should be taken. Taking two medicines at the same time can result in blood pressure that is too low. SEEK IMMEDIATE MEDICAL CARE IF:  You develop a severe headache, blurred or changing vision, or confusion.  You have unusual weakness or numbness, or a faint feeling.  You have severe chest or abdominal pain, vomiting, or breathing problems. MAKE SURE YOU:   Understand these instructions.  Will watch your condition.  Will get help right away if you are not doing well or get worse. Document Released: 09/26/2005 Document Revised: 12/19/2011 Document Reviewed: 05/16/2008 [facility] Patient Information 2014 [place], [place].  Migraine Headache A migraine headache is an intense, throbbing pain on one or both  sides of your head. A migraine can last for 30 minutes to several hours. CAUSES  The exact cause of a migraine headache is not always known. However, a migraine may be caused when nerves in the brain become irritated and release chemicals that cause inflammation. This causes pain. Certain things may also trigger migraines, such as:  Alcohol.  Smoking.  Stress.  Menstruation.  Aged cheeses.  Foods or drinks that contain nitrates, glutamate, aspartame, or tyramine.  Lack of sleep.  Chocolate.  Caffeine.  Hunger.  Physical exertion.  Fatigue.  Medicines used to treat chest pain (nitroglycerine), birth control pills, estrogen, and some blood pressure medicines. SIGNS AND SYMPTOMS  Pain on one or both sides of your head.  Pulsating or throbbing pain.  Severe pain that prevents daily activities.  Pain that is aggravated by any physical activity.  Nausea, vomiting, or both.  Dizziness.  Pain with exposure to bright lights, loud noises, or activity.  General sensitivity to bright lights, loud noises, or smells. Before you get a migraine, you may get warning signs that a migraine is coming (aura). An aura may include:  Seeing flashing lights.  Seeing bright spots, halos, or zig-zag lines.  Having tunnel vision or blurred vision.  Having feelings of numbness or tingling.  Having trouble talking.  Having muscle weakness. DIAGNOSIS  A migraine headache is often diagnosed based on:  Symptoms.  Physical exam.  A CT scan or MRI of your head. These imaging tests cannot diagnose migraines, but they can help rule out other causes of headaches. TREATMENT Medicines may be given for pain and nausea. Medicines can also be given to help prevent recurrent migraines.  HOME CARE INSTRUCTIONS  Only take over-the-counter or prescription medicines for pain or discomfort as directed by your health care provider. The use of long-term narcotics is not recommended.  Lie down  in a dark, quiet room when you have a migraine.  Keep a journal to find out what may trigger your migraine headaches. For example, write down:  What you eat and drink.  How much sleep you get.  Any change to your diet or medicines.  Limit alcohol consumption.  Quit smoking if you smoke.  Get 7 9 hours of sleep, or as recommended by your health care provider.  Limit stress.  Keep lights dim if bright lights bother you and make your migraines worse. SEEK IMMEDIATE MEDICAL CARE IF:   Your migraine becomes severe.  You have a fever.  You have a stiff neck.  You have vision loss.  You have muscular weakness or loss of muscle control.  You start losing your balance or have trouble walking.  You feel faint or pass out.  You have severe symptoms that are different from your first symptoms. MAKE SURE YOU:   Understand these instructions.  Will watch your condition.  Will get help right away if you are not doing well or get worse. Document Released: 09/26/2005 Document Revised: 07/17/2013 Document Reviewed: 06/03/2013 [facility] Patient Information 2014 [place], [place].  Chest Pain (Nonspecific) It is often hard to give a specific diagnosis for the cause of chest pain. There is always a chance that your pain could be related to something  serious, such as a heart attack or a blood clot in the lungs. You need to follow up with your caregiver for further evaluation. CAUSES   Heartburn.  Pneumonia or bronchitis.  Anxiety or stress.  Inflammation around your heart (pericarditis) or lung (pleuritis or pleurisy).  A blood clot in the lung.  A collapsed lung (pneumothorax). It can develop suddenly on its own (spontaneous pneumothorax) or from injury (trauma) to the chest.  Shingles infection (herpes zoster virus). The chest wall is composed of bones, muscles, and cartilage. Any of these can be the source of the pain.  The bones can be bruised by injury.  The muscles or  cartilage can be strained by coughing or overwork.  The cartilage can be affected by inflammation and become sore (costochondritis). DIAGNOSIS  Lab tests or other studies, such as X-rays, electrocardiography, stress testing, or cardiac imaging, may be needed to find the cause of your pain.  TREATMENT   Treatment depends on what may be causing your chest pain. Treatment may include:  Acid blockers for heartburn.  Anti-inflammatory medicine.  Pain medicine for inflammatory conditions.  Antibiotics if an infection is present.  You may be advised to change lifestyle habits. This includes stopping smoking and avoiding alcohol, caffeine, and chocolate.  You may be advised to keep your head raised (elevated) when sleeping. This reduces the chance of acid going backward from your stomach into your esophagus.  Most of the time, nonspecific chest pain will improve within 2 to 3 days with rest and mild pain medicine. HOME CARE INSTRUCTIONS   If antibiotics were prescribed, take your antibiotics as directed. Finish them even if you start to feel better.  For the next few days, avoid physical activities that bring on chest pain. Continue physical activities as directed.  Do not smoke.  Avoid drinking alcohol.  Only take over-the-counter or prescription medicine for pain, discomfort, or fever as directed by your caregiver.  Follow your caregiver's suggestions for further testing if your chest pain does not go away.  Keep any follow-up appointments you made. If you do not go to an appointment, you could develop lasting (chronic) problems with pain. If there is any problem keeping an appointment, you must call to reschedule. SEEK MEDICAL CARE IF:   You think you are having problems from the medicine you are taking. Read your medicine instructions carefully.  Your chest pain does not go away, even after treatment.  You develop a rash with blisters on your chest. SEEK IMMEDIATE MEDICAL CARE  IF:   You have increased chest pain or pain that spreads to your arm, neck, jaw, back, or abdomen.  You develop shortness of breath, an increasing cough, or you are coughing up blood.  You have severe back or abdominal pain, feel nauseous, or vomit.  You develop severe weakness, fainting, or chills.  You have a fever. THIS IS AN EMERGENCY. Do not wait to see if the pain will go away. Get medical help at once. Call your local emergency services (911 in [place] Do not drive yourself to the hospital. MAKE SURE YOU:   Understand these instructions.  Will watch your condition.  Will get help right away if you are not doing well or get worse. Document Released: 07/06/2005 Document Revised: 12/19/2011 Document Reviewed: 05/01/2008 [facility] Patient Information 2014 [place], [place].   Emergency Department Resource Guide 1) Find a Doctor and Pay Out of Pocket Although you won't have to find out who is covered by your insurance plan,  it is a good idea to ask around and get recommendations. You will then need to call the office and see if the doctor you have chosen will accept you as a new patient and what types of options they offer for patients who are self-pay. Some doctors offer discounts or will set up payment plans for their patients who do not have insurance, but you will need to ask so you aren't surprised when you get to your appointment.  2) Contact Your Local Health Department Not all health departments have doctors that can see patients for sick visits, but many do, so it is worth a call to see if yours does. If you don't know where your local health department is, you can check in your phone book. The CDC also has a tool to help you locate your state's health department, and many state websites also have listings of all of their local health departments.  3) Find a Walk-in Clinic If your illness is not likely to be very severe or complicated, you may want to try a walk in clinic. These  are popping up all over the country in pharmacies, drugstores, and shopping centers. They're usually staffed by nurse practitioners or physician assistants that have been trained to treat common illnesses and complaints. They're usually fairly quick and inexpensive. However, if you have serious medical issues or chronic medical problems, these are probably not your best option.  No Primary Care Doctor: - Call Health Connect at  [phone] - they can help you locate a primary care doctor that  accepts your insurance, provides certain services, etc. - Physician Referral Service- [phone]  Chronic Pain Problems: Organization         Address  Phone   Notes  [facility]  [phone] Patients need to be referred by their primary care doctor.   Medication Assistance: Organization         Address  Phone   Notes  [facility] Medication [facility] [address] [place] [place], [place] [address] [phone] --Must be a resident of [facility] -- Must have NO insurance coverage whatsoever (no Medicaid/ Medicare, etc.) -- The pt. MUST have a primary care doctor that directs their care regularly and follows them in the community   [facility]  [phone]   [employer]  [phone]    Agencies that provide inexpensive medical care: Organization         Address  Phone   Notes  [facility]  [phone]   [facility]    [phone]   [facility] [address] [place], [place] [address] [phone]   [facility] [address] [place], [place] [phone]   Planned Parenthood    [phone]   [facility]    [phone]   [facility]  [address], [place] Phone:  [phone], Fax:  [phone] Hours of Operation:  9 am - 6 pm, M-F.  Also accepts Medicaid/Medicare and self-pay.  [facility]   [address], [place] Phone: [phone], Fax: [phone]. Hours of Operation:  8:30 am - 5:30 pm, M-F.  Also accepts Medicaid and self-pay.  [facility] [place] [address] [place] Phone: [phone]   [facility] [address] Natasha Bence [place], [place] [phone], Ext. [phone] Mondays & Thursdays: 7-9  AM.  First 15 patients are seen on a first come, first serve basis.    Medicaid-accepting [facility] Providers:  Organization         Address  Phone   Notes  [facility] [address],  [phone] Also accepts self-pay patients.  [facility] [address] Laurell Josephs [place], [place]  [phone]   [facility] [address], [place] [phone]   [facility] [address], [place] [phone]   Renaye Rakers [address], [place]   [phone] Only accepts [place] Access [place] patients after they have their name applied to their card.   Self-Pay (no insurance) in [facility]:  Organization         Address  Phone   Notes  Sickle Cell Patients, [facility] [address] [place], [place] [phone]   [facility] [address] [place], [place] [phone]   [facility] [place]  [address], [place] [phone]   [facility]/Dr. Osei-Bonsu  [address] [place] or [address], [place] [phone] Phone number for both [place] and [place] locations is the same.  [facility] [address] [place] [phone]   [facility] [address], [place] or [address] [phone]   [facility] [address] [place] [phone], phone; [phone], fax Sees patients 1st and 3rd  Saturday of every month.  Must not qualify for public or private insurance (i.e. Medicaid, Medicare, [place] Health Choice, Veterans' Benefits)  Household income should be no more than 200% of the poverty level The clinic cannot treat you if you are pregnant or think you are pregnant  Sexually transmitted diseases are not treated at the clinic.    Dental Care: Organization         Address  Phone  Notes  [facility] Department of [facility] [address] [place], [place] [phone] Accepts children up to age 66 who are enrolled in [place] or [place] Health Choice; pregnant women with a Medicaid card; and children who have applied for Medicaid or [place] Health Choice, but were declined, whose parents can pay a reduced fee at time of service.  [facility] of [facility]  [address], [place] [phone] Accepts children up to age 9 who are enrolled in [place] or [place] Health Choice; pregnant women with a Medicaid card; and children who have applied for Medicaid or  Health Choice, but were declined, whose parents can pay a reduced fee at time of service.  [facility]  [address] [place], [place] [phone] Patients are seen by appointment only. Walk-ins are not accepted. [facility] will see patients 18 years of age and older. Monday - Tuesday (8am-5pm) Most Wednesdays (8:30-5pm) $30 per visit, cash only  [facility] Adult [facility]  [address], [facility] [phone] Patients are seen by appointment only. Walk-ins are not accepted. [facility] will see patients 42 years of age and older. One Wednesday Evening (Monthly: Volunteer Based).  $30 per visit, cash only  [employer]  [phone] for adults; Children under age 40, call  [facility] at [phone]. Children aged 107-14, please call [phone] to request a pediatric  application.  Dental services are provided in all areas of dental care including fillings, crowns and bridges, complete and partial dentures, implants, gum treatment, root canals, and extractions. Preventive care is also provided. Treatment is provided to both adults and children. Patients are selected via a lottery and there is often a waiting list.   [facility] [address] [place]  [phone] [email or web address]   [facility] [address] [place], [place] [phone], Ext. [phone] Second and Fourth Thursday of each month, opens at 6:30 AM; Clinic ends at 9 AM.  Patients are seen on a first-come first-served basis, and a limited number are seen during each clinic.   [facility]  [address] Ether Griffins [place], [place] [phone]   Eligibility Requirements You must have lived in [place], [place], or [place] counties for at least the last three months.   You cannot be eligible for state or federal sponsored [place], including [employer], [place], or [employer].   You generally cannot be eligible for healthcare insurance through your employer.    How to apply: Eligibility screenings are held every Tuesday and Wednesday afternoon from 1:00 pm until 4:00 pm. You do not need an appointment for the interview!  [facility] [address], [place], [place] [phone]   [facility] Health Department  [phone]   [facility] Health Department  [phone]   [facility] Health Department  [phone]    Behavioral Health Resources in the Community: Intensive Outpatient Programs Organization         Address  Phone  Notes  [facility] [address], [place], [place] [phone]   [facility] Outpatient [address], [place], [place] [phone]   [facility] [address] [place], [place]  [phone]   [facility]  [address],  [place], [place] [phone] or [phone]   Substance Abuse Resources Organization         Address  Phone  Notes  [facility]  [phone]   [facility]  [phone]   [facility]  [phone]   Floydene Flock  [phone]   Residential & Outpatient Substance Abuse Program  [phone]   Psychological Services Organization         Address  Phone  Notes  [facility] Behavioral Health  [phone]   [facility]  [phone]   [facility] [address], [place] [phone] or [phone]    Mobile Crisis Teams Organization         Address  Phone  Notes  [facility], Mobile Crisis Care Unit  [phone]   [facility]  [address] [place], [place] [phone]   Doristine Locks [address] [place] [phone]    Self-Help/Support Groups Organization         Address  Phone             Notes  [facility] [place] - variety of support groups  336- I7437963 Call for more information  Narcotics Anonymous (NA), [facility] [address], [employer] [place]  2 meetings at this location   Statistician         Address  Phone  Notes  [facility] [address],  [place]  [phone]   [facility]  [address] [place] [phone], [place], [place] [phone]   [facility] [address] [place], [place] [phone] Admissions: 8am-3pm M-F  [facility] [address]    [place], [place] [phone]   [facility] [address] [place], [place], [place] [phone]   [facility] [address]  [place], [place] [phone]   [facility] - Intensive Outpatient [address] Laurell Josephs [address], [place], [place] [phone]   [facility] ([facility] [address] [place]   [place], [place] [phone] or [phone]   [facility] ([facility]) [address], [place], [place] [phone] Accepts Medicaid  [facility] [address],  [place] [phone] Substance Abuse/Addiction Treatment   [facility] Organization         Address  Phone  Notes  [facility]  [phone]   Angie Fava, PhD [address] Ervin Knack [place], [place]   [phone] or [phone]   [facility]al   [address] [place], [place] [phone]   [facility] [address], [place], [place] [phone] Insurance/Medicaid/sponsorship through [facility] [address]                                    [place], [place] [phone] Therapy/tele-psych/case  [facility] [address], [place] [phone]    Dr. Lolly Mustache  [phone]   [facility]  [employer] [facility] Dept. 1) [address], [place] 2) [address] [place] 3)  [address], [place] [phone]  [phone]   [facility] Child Abuse Hotline ([phone] or 6123508501 (After Hours)

## 2013-12-13 NOTE — ED Provider Notes (Signed)
CSN: [id number]     Arrival date & time 12/13/13  0930 History   First MD Initiated Contact with Patient 12/13/13 1115     Chief Complaint  Patient presents with  . Hypertension    HPI  Sabrina [name] is a 33 y.o. female with a PMH of HTN, migraines and gestational HTN who presents to the ED for evaluation of HTN.  History was provided by the patient. Patient states that she has had a throbbing constant right sided headache for the past two days. Her headache is worse with bright lights. She has not taken anything for her headache. Headache similar to migraines in the past. She states that her headache is also similar to when "my blood pressure gets high." She denies any weakness, loss of sensation, slurred speech, ataxia, confusion, numbness/tingling. She went to her doctor yesterday and was prescribed lisinopril-HCTZ 10-12.5 which she started taking last night. Woke up today "not feeling well." She also complains of a non-productive cough for the past few weeks. Not a smoker. She also has had chest pain off and on over the past month. Her chest pain is located on the left side of her chest without radiation and described as a dull sensation. Her chest pain is also worse when her BP elevates. No SOB, dyspnea, nausea, lightheadedness, dizziness. No fevers, chills, sore throat, ear pain, rhinorrhea, congestion, or leg swelling.   Past Medical History  Diagnosis Date  . Hypertension   . Pregnancy induced hypertension   . Abnormal Pap smear   . Postpartum care following cesarean delivery (10/11) 07/21/2011  . Gestational hypertension 07/21/2011  . Maternal iron deficiency anemia complicating pregnancy 07/22/2011   Past Surgical History  Procedure Laterality Date  . Leep    . Cesarean section  07/21/2011    Procedure: CESAREAN SECTION;  Surgeon: Serita Kyle, MD;  Location: [facility];  Service: Gynecology;  Laterality: N/A;   History reviewed. No pertinent family history. History   Substance Use Topics  . Smoking status: Never Smoker   . Smokeless tobacco: Never Used  . Alcohol Use: No   OB History   Grav Para Term Preterm Abortions TAB SAB Ect Mult Living   1 1 1       1      Review of Systems  Constitutional: Positive for fatigue. Negative for fever, chills, diaphoresis, activity change and appetite change.  HENT: Negative for congestion, ear pain, rhinorrhea and sore throat.   Eyes: Positive for photophobia. Negative for pain and visual disturbance.  Respiratory: Positive for cough. Negative for chest tightness, shortness of breath and wheezing.   Cardiovascular: Positive for chest pain. Negative for palpitations and leg swelling.  Gastrointestinal: Negative for nausea, vomiting, abdominal pain, diarrhea and constipation.  Genitourinary: Negative for dysuria and difficulty urinating.  Musculoskeletal: Negative for back pain, myalgias, neck pain and neck stiffness.  Skin: Negative for rash.  Neurological: Positive for headaches. Negative for dizziness, syncope, weakness, light-headedness and numbness.    Allergies  Review of patient's allergies indicates no known allergies.  Home Medications   Current Outpatient Rx  Name  Route  Sig  Dispense  Refill  . aspirin-acetaminophen-caffeine (EXCEDRIN MIGRAINE) 250-250-65 MG per tablet   Oral   Take 1 tablet by mouth every 6 (six) hours as needed for headache.         . guaiFENesin (MUCINEX) 600 MG 12 hr tablet   Oral   Take 600 mg by mouth 2 (two) times daily as needed  for to loosen phlegm.         . Homeopathic Products (CAMILIA PO)   Oral   Take 1 tablet by mouth daily.         Marland Kitchen. levocetirizine (XYZAL) 5 MG tablet   Oral   Take 5 mg by mouth every evening.           Marland Kitchen. lisinopril-hydrochlorothiazide (PRINZIDE,ZESTORETIC) 10-12.5 MG per tablet   Oral   Take 1 tablet by mouth daily.         Marland Kitchen. EXPIRED: NIFEdipine (PROCARDIA-XL/ADALAT CC) 30 MG 24 hr tablet   Oral   Take 1 tablet (30 mg  total) by mouth daily.   30 tablet   0    BP 153/101  Pulse 70  Temp(Src) 98.5 F (36.9 C) (Oral)  Resp 20  SpO2 98%  Filed Vitals:   12/13/13 0947 12/13/13 1134 12/13/13 1200 12/13/13 1230  BP: 153/101 156/92 149/96 139/97  Pulse: 70 62 58 59  Temp: 98.5 F (36.9 C)     TempSrc: Oral     Resp: 20 19 13 12   SpO2: 98% 100% 100% 100%    Physical Exam  Nursing note and vitals reviewed. Constitutional: She is oriented to person, place, and time. She appears well-developed and well-nourished. No distress.  HENT:  Head: Normocephalic and atraumatic.  Right Ear: External ear normal.  Left Ear: External ear normal.  Nose: Nose normal.  Mouth/Throat: Oropharynx is clear and moist. No oropharyngeal exudate.  No tenderness to the scalp or face throughout. No palpable hematoma, step-offs, or lacerations throughout.  Tympanic membranes gray and translucent bilaterally.    Eyes: Conjunctivae and EOM are normal. Pupils are equal, round, and reactive to light. Right eye exhibits no discharge. Left eye exhibits no discharge.  Neck: Normal range of motion. Neck supple.  No cervical lymphadenopathy. No nuchal rigidity.   Cardiovascular: Normal rate, regular rhythm, normal heart sounds and intact distal pulses.  Exam reveals no gallop and no friction rub.   No murmur heard. Pulmonary/Chest: Effort normal and breath sounds normal. No respiratory distress. She has no wheezes. She has no rales. She exhibits no tenderness.  Abdominal: Soft. She exhibits no distension and no mass. There is no tenderness. There is no rebound and no guarding.  Musculoskeletal: Normal range of motion. She exhibits no edema and no tenderness.  Strength 5/5 in the upper and lower extremities bilaterally.   Neurological: She is alert and oriented to person, place, and time.  GCS 15.  No focal neurological deficits.  CN 2-12 intact.  No pronator drift.    Skin: Skin is warm and dry. She is not diaphoretic.    ED  Course  Procedures (including critical care time) Labs Review Labs Reviewed - No data to display Imaging Review No results found.   EKG Interpretation   Date/Time:  Friday December 13 2013 13:25:01 EST Ventricular Rate:  64 PR Interval:  181 QRS Duration: 105 QT Interval:  429 QTC Calculation: 443 R Axis:   26 Text Interpretation:  Sinus rhythm RSR' in V1 or V2, right VCD or RVH No  previous tracing Confirmed by Chalmers P. Wylie [facility] Care CenterLUNKETT  MD, WHITNEY ([id number]) on 12/13/2013  2:39:47 PM      Results for orders placed during the hospital encounter of 12/13/13  CBC WITH DIFFERENTIAL      Result Value Ref Range   WBC 9.9  4.0 - 10.5 K/uL   RBC 4.70  3.87 - 5.11 MIL/uL   Hemoglobin 13.5  12.0 - 15.0 g/dL   HCT 09.8  11.9 - 14.7 %   MCV 84.7  78.0 - 100.0 fL   MCH 28.7  26.0 - 34.0 pg   MCHC 33.9  30.0 - 36.0 g/dL   RDW 82.9  56.2 - 13.0 %   Platelets 285  150 - 400 K/uL   Neutrophils Relative % 70  43 - 77 %   Neutro Abs 6.9  1.7 - 7.7 K/uL   Lymphocytes Relative 25  12 - 46 %   Lymphs Abs 2.5  0.7 - 4.0 K/uL   Monocytes Relative 4  3 - 12 %   Monocytes Absolute 0.4  0.1 - 1.0 K/uL   Eosinophils Relative 1  0 - 5 %   Eosinophils Absolute 0.1  0.0 - 0.7 K/uL   Basophils Relative 0  0 - 1 %   Basophils Absolute 0.0  0.0 - 0.1 K/uL  COMPREHENSIVE METABOLIC PANEL      Result Value Ref Range   Sodium 141  137 - 147 mEq/L   Potassium 4.1  3.7 - 5.3 mEq/L   Chloride 102  96 - 112 mEq/L   CO2 28  19 - 32 mEq/L   Glucose, Bld 77  70 - 99 mg/dL   BUN 10  6 - 23 mg/dL   Creatinine, Ser 8.65  0.50 - 1.10 mg/dL   Calcium 9.9  8.4 - 78.4 mg/dL   Total Protein 8.3  6.0 - 8.3 g/dL   Albumin 4.0  3.5 - 5.2 g/dL   AST 15  0 - 37 U/L   ALT 17  0 - 35 U/L   Alkaline Phosphatase 93  39 - 117 U/L   Total Bilirubin 0.3  0.3 - 1.2 mg/dL   GFR calc non Af Amer >90  >90 mL/min   GFR calc Af Amer >90  >90 mL/min  TROPONIN I      Result Value Ref Range   Troponin I <0.30  <0.30 ng/mL  URINALYSIS, ROUTINE W  REFLEX MICROSCOPIC      Result Value Ref Range   Color, Urine YELLOW  YELLOW   APPearance HAZY (*) CLEAR   Specific Gravity, Urine 1.011  1.005 - 1.030   pH 5.5  5.0 - 8.0   Glucose, UA NEGATIVE  NEGATIVE mg/dL   Hgb urine dipstick LARGE (*) NEGATIVE   Bilirubin Urine NEGATIVE  NEGATIVE   Ketones, ur NEGATIVE  NEGATIVE mg/dL   Protein, ur NEGATIVE  NEGATIVE mg/dL   Urobilinogen, UA 0.2  0.0 - 1.0 mg/dL   Nitrite NEGATIVE  NEGATIVE   Leukocytes, UA NEGATIVE  NEGATIVE  PREGNANCY, URINE      Result Value Ref Range   Preg Test, Ur NEGATIVE  NEGATIVE  URINE MICROSCOPIC-ADD ON      Result Value Ref Range   Squamous Epithelial / LPF RARE  RARE   WBC, UA 0-2  <3 WBC/hpf   RBC / HPF 11-20  <3 RBC/hpf   Bacteria, UA FEW (*) RARE    DG Chest 2 View (Final result)  Result time: 12/13/13 13:36:07    Final result by Rad Results In Interface (12/13/13 13:36:07)    Narrative:   CLINICAL DATA: chest pain  EXAM: CHEST 2 VIEW  COMPARISON: None.  FINDINGS: The heart size and mediastinal contours are within normal limits. Both lungs are clear. The visualized skeletal structures are unremarkable.  IMPRESSION: No active cardiopulmonary disease.   Electronically Signed By: Salome Holmes M.D. On:  12/13/2013 13:36          MDM   SOPHIANA MILANESE is a 33 y.o. female with a PMH of HTN, migraines and gestational HTN who presents to the ED for evaluation of HTN.   Rechecks  2:05 PM = headache improved and is rated a 6/10. Will re-check. Patient on menstrual period.  2:40 PM = Headache continues to improve. Patient able to ambulate without difficulty or ataxia. States she feels much better. Ready for discharge.     Patient evaluated for hypertension. Started on lisinopril-HCTZ yesterday. BP not significantly elevated. (130-150 systolic). Patient also complained of a headache similar to headaches in the past. Headache improved with IV fluids, Benadryl and Reglan in the ED. No  neurological deficits. Patient also complained of chest pain x 1 month. EKG negative for any acute ischemic changes. Troponin negative. Chest x-ray negative for an acute cardiopulmonary process. Labs unremarkable. Instructed to continue anti-hypertensive medications and follow-up with PCP. Return precautions, discharge instructions, and follow-up was discussed with the patient before discharge.     Discharge Medication List as of 12/13/2013  2:44 PM       Final impressions: 1. Hypertension   2. Chest pain   3. Headache       Luiz Iron PA-C   This patient was discussed with Dr. Mingo Amber, PA-C 12/14/13 [phone]

## 2013-12-13 NOTE — ED Notes (Signed)
Pt states she had HTN during her pregnancy 2.5 years ago and was on BP medication at that time but was taken off of it shortly after pregnancy. For the past month shes been having headaches and she went to the nurse at work and her BP was elevated. Yesterday they sent her to a doctor because her BP was 160./100, she was started on lisinopril last night but she continues to have headache today and "i just dont feel well."

## 2013-12-15 NOTE — ED Provider Notes (Signed)
Medical screening examination/treatment/procedure(s) were performed by non-physician practitioner and as supervising physician I was immediately available for consultation/collaboration.   EKG Interpretation   Date/Time:  Friday December 13 2013 13:25:01 EST Ventricular Rate:  64 PR Interval:  181 QRS Duration: 105 QT Interval:  429 QTC Calculation: 443 R Axis:   26 Text Interpretation:  Sinus rhythm RSR' in V1 or V2, right VCD or RVH No  previous tracing Confirmed by Anitra LauthPLUNKETT  MD, Othello Sgroi ([id number]) on 12/13/2013  2:39:47 PM        Gwyneth SproutWhitney Hiilei Gerst, MD 12/15/13 [phone]

## 2014-08-05 ENCOUNTER — Ambulatory Visit (INDEPENDENT_AMBULATORY_CARE_PROVIDER_SITE_OTHER): Payer: BC Managed Care – PPO | Admitting: Internal Medicine

## 2014-08-05 VITALS — BP 108/73 | HR 66 | Temp 99.0°F | Resp 20 | Ht 69.5 in | Wt 225.5 lb

## 2014-08-05 DIAGNOSIS — J0100 Acute maxillary sinusitis, unspecified: Secondary | ICD-10-CM

## 2014-08-05 DIAGNOSIS — R05 Cough: Secondary | ICD-10-CM

## 2014-08-05 DIAGNOSIS — R059 Cough, unspecified: Secondary | ICD-10-CM

## 2014-08-05 MED ORDER — BENZONATATE 100 MG PO CAPS: 100.0000 mg | ORAL_CAPSULE | Freq: Three times a day (TID) | ORAL | Status: DC

## 2014-08-05 MED ORDER — AMOXICILLIN 875 MG PO TABS: 875.0000 mg | ORAL_TABLET | Freq: Two times a day (BID) | ORAL | Status: DC

## 2014-08-05 MED ORDER — HYDROCODONE-HOMATROPINE 5-1.5 MG/5ML PO SYRP: 5.0000 mL | ORAL_SOLUTION | Freq: Four times a day (QID) | ORAL | Status: DC | PRN

## 2014-08-05 NOTE — Progress Notes (Signed)
Subjective:  This chart was scribed for Sabrina Siaobert Seydou Hearns, MD by Evon Slackerrance Branch, ED Scribe. This Patient was seen in room 01 and the patients care was started at 7:14 PM   Patient ID: Sabrina [name], female    DOB: 21-May-1981, 33 y.o.   MRN: [id number]  Cough Pertinent negatives include no fever.   HPI Comments: Sabrina [name] is a 33 y.o. female who presents to the [facility] complaining of persistent non productive cough onset 2 weeks ago. She states that she is on lisinopril and has always noticed that she has a slight dry cough. She states that cough first started from when her daughter was sick with cold symptoms. Denies fever or other related symptoms.  Denies Hx of asthma  Patient Active Problem List   Diagnosis Date Noted  . Maternal iron deficiency anemia complicating pregnancy 07/22/2011  . Postpartum care following cesarean delivery (10/11) 07/21/2011  . Gestational hypertension 07/21/2011   Prior to Admission medications   Medication Sig Start Date End Date Taking? Authorizing Provider  aspirin-acetaminophen-caffeine (EXCEDRIN MIGRAINE) 6703979066250-250-65 MG per tablet Take 1 tablet by mouth every 6 (six) hours as needed for headache.   Yes Historical Provider, MD  levocetirizine (XYZAL) 5 MG tablet Take 5 mg by mouth every evening.     Yes Historical Provider, MD  lisinopril-hydrochlorothiazide (PRINZIDE,ZESTORETIC) 10-12.5 MG per tablet Take 1 tablet by mouth daily.   Yes Historical Provider, MD  norethindrone (MICRONOR,CAMILA,ERRIN) 0.35 MG tablet Take 1 tablet by mouth daily.   Yes Historical Provider, MD  amoxicillin (AMOXIL) 875 MG tablet Take 1 tablet (875 mg total) by mouth 2 (two) times daily. 08/05/14   Tonye Pearsonobert P Kihanna Kamiya, MD  benzonatate (TESSALON) 100 MG capsule Take 1 capsule (100 mg total) by mouth 3 (three) times daily. 08/05/14   Tonye Pearsonobert P Daray Polgar, MD  HYDROcodone-homatropine [facility](HYCODAN) 5-1.5 MG/5ML syrup Take 5 mLs by mouth every 6 (six)  hours as needed. 08/05/14   Tonye Pearsonobert P Robi Mitter, MD   No Known Allergies  Review of Systems  Constitutional: Negative for fever.  Respiratory: Positive for cough.     Objective:   BP 108/73  Pulse 66  Temp(Src) 99 F (37.2 C) (Oral)  Resp 20  Ht 5' 9.5" (1.765 m)  Wt 225 lb 8 oz (102.286 kg)  BMI 32.83 kg/m2  SpO2 100%  LMP 07/19/2014  Breastfeeding? No   Physical Exam  Nursing note and vitals reviewed. Constitutional: She is oriented to person, place, and time. She appears well-developed and well-nourished. No distress.  HENT:  Head: Normocephalic and atraumatic.  Right Ear: External ear normal.  Left Ear: External ear normal.  Mouth/Throat: Oropharynx is clear and moist.  Purulent discharge in nose   Eyes: Conjunctivae and EOM are normal.  Neck: Neck supple. No thyromegaly present.  Cardiovascular: Normal rate, regular rhythm and normal heart sounds.   Pulmonary/Chest: Effort normal and breath sounds normal. No respiratory distress. She has no wheezes.  Musculoskeletal: Normal range of motion.  Lymphadenopathy:    She has no cervical adenopathy.  Neurological: She is alert and oriented to person, place, and time.  Skin: Skin is warm and dry.  Psychiatric: She has a normal mood and affect. Her behavior is normal.     Assessment & Plan:  Cough  Acute maxillary sinusitis, recurrence not specified  Meds ordered this encounter  Medications  . norethindrone (MICRONOR,CAMILA,ERRIN) 0.35 MG tablet    Sig: Take 1 tablet by mouth daily.  Marland Kitchen. HYDROcodone-homatropine (  HYCODAN) 5-1.5 MG/5ML syrup    Sig: Take 5 mLs by mouth every 6 (six) hours as needed.    Dispense:  120 mL    Refill:  0  . benzonatate (TESSALON) 100 MG capsule    Sig: Take 1 capsule (100 mg total) by mouth 3 (three) times daily.    Dispense:  1 capsule    Refill:  0  . amoxicillin (AMOXIL) 875 MG tablet    Sig: Take 1 tablet (875 mg total) by mouth 2 (two) times daily.    Dispense:  20 tablet     Refill:  0       I have completed the patient encounter in its entirety as documented by the scribe, with editing by me where necessary. Sabrina [name], M.D.

## 2014-08-06 ENCOUNTER — Telehealth: Payer: Self-pay

## 2014-08-06 MED ORDER — BENZONATATE 100 MG PO CAPS: 100.0000 mg | ORAL_CAPSULE | Freq: Three times a day (TID) | ORAL | Status: DC

## 2014-08-06 NOTE — Telephone Encounter (Signed)
Pt called to say that Dr. Merla Richesoolittle only sent in one tablet of Tessalon Perles. Sent in #30 for her. Hope this is ok.

## 2014-09-18 DIAGNOSIS — L502 Urticaria due to cold and heat: Secondary | ICD-10-CM | POA: Insufficient documentation

## 2015-07-01 DIAGNOSIS — L700 Acne vulgaris: Secondary | ICD-10-CM | POA: Insufficient documentation

## 2015-07-02 DIAGNOSIS — L280 Lichen simplex chronicus: Secondary | ICD-10-CM | POA: Insufficient documentation

## 2017-02-09 DIAGNOSIS — L2084 Intrinsic (allergic) eczema: Secondary | ICD-10-CM | POA: Insufficient documentation

## 2017-02-09 DIAGNOSIS — L249 Irritant contact dermatitis, unspecified cause: Secondary | ICD-10-CM | POA: Insufficient documentation

## 2017-06-28 ENCOUNTER — Other Ambulatory Visit: Payer: Self-pay | Admitting: Nurse Practitioner

## 2017-06-28 ENCOUNTER — Ambulatory Visit
Admission: RE | Admit: 2017-06-28 | Discharge: 2017-06-28 | Disposition: A | Discharge status: Home / Self Care | Payer: BLUE CROSS/BLUE SHIELD | Source: Ambulatory Visit | Attending: Nurse Practitioner | Admitting: Nurse Practitioner

## 2017-06-28 DIAGNOSIS — R0789 Other chest pain: Secondary | ICD-10-CM

## 2017-10-11 DIAGNOSIS — R7309 Other abnormal glucose: Secondary | ICD-10-CM | POA: Diagnosis not present

## 2017-10-11 DIAGNOSIS — R0789 Other chest pain: Secondary | ICD-10-CM | POA: Diagnosis not present

## 2017-10-11 DIAGNOSIS — E669 Obesity, unspecified: Secondary | ICD-10-CM | POA: Diagnosis not present

## 2017-10-11 DIAGNOSIS — Z6830 Body mass index (BMI) 30.0-30.9, adult: Secondary | ICD-10-CM | POA: Diagnosis not present

## 2017-10-11 DIAGNOSIS — I10 Essential (primary) hypertension: Secondary | ICD-10-CM | POA: Diagnosis not present

## 2018-05-09 DIAGNOSIS — Z01419 Encounter for gynecological examination (general) (routine) without abnormal findings: Secondary | ICD-10-CM | POA: Diagnosis not present

## 2018-05-09 DIAGNOSIS — Z1151 Encounter for screening for human papillomavirus (HPV): Secondary | ICD-10-CM | POA: Diagnosis not present

## 2018-05-09 DIAGNOSIS — Z6831 Body mass index (BMI) 31.0-31.9, adult: Secondary | ICD-10-CM | POA: Diagnosis not present

## 2018-07-02 DIAGNOSIS — R946 Abnormal results of thyroid function studies: Secondary | ICD-10-CM | POA: Diagnosis not present

## 2018-07-02 DIAGNOSIS — R0789 Other chest pain: Secondary | ICD-10-CM | POA: Diagnosis not present

## 2018-07-11 ENCOUNTER — Ambulatory Visit: Payer: BLUE CROSS/BLUE SHIELD | Admitting: Cardiovascular Disease

## 2018-07-11 ENCOUNTER — Encounter: Payer: Self-pay | Admitting: Cardiovascular Disease

## 2018-07-11 VITALS — BP 134/84 | HR 68 | Ht 69.5 in | Wt 215.0 lb

## 2018-07-11 DIAGNOSIS — I10 Essential (primary) hypertension: Secondary | ICD-10-CM | POA: Insufficient documentation

## 2018-07-11 DIAGNOSIS — Z136 Encounter for screening for cardiovascular disorders: Secondary | ICD-10-CM | POA: Diagnosis not present

## 2018-07-11 DIAGNOSIS — R0789 Other chest pain: Secondary | ICD-10-CM

## 2018-07-11 HISTORY — DX: Other chest pain: R07.89

## 2018-07-11 HISTORY — DX: Essential (primary) hypertension: I10

## 2018-07-11 NOTE — Progress Notes (Signed)
Cardiology Office Note   Date:  07/11/2018   ID:  Sabrina [name], DOB 1981-02-13, MRN [id number]  PCP:  Renford Dills, MD  Cardiologist:   Chilton Si, MD   No chief complaint on file.    History of Present Illness: Sabrina [name] is a 37 y.o. female with hypertension who is being seen today for the evaluation of chest pressure at the request of Maxie Better, MD.  FOr the last two years she has noted intermitted chest pressure.  The episodes occur every day and last for 1-2 seconds.  There is no associated shortness of breath, nausea or diaphoresis.  It is in her left upper chest and sometimes radiates under her left arm.  The episodes are sporadic and occur multiple times throughout the day.  It is random and often happens when sitting at work or resting.  She does not recall ever having these symptoms with exertion.  However, she is not getting much exercise.  She was diagnosed with hypertension 7 years ago when she was pregnant.  Her PCP previously did a chest x-ray noted some mild bronchitis on the side.  However the symptoms have persisted.  She struggles with getting exercise, as her husband works a second shift and they have a young child.  She has been feeling tired a lot lately.  She sleeps 6 to 7 hours each night.  She recently had a new PCP who checked her thyroid and noted that her TSH was mildly elevated.  He plans to repeat it in 6 months.   Past Medical History:  Diagnosis Date  . Abnormal Pap smear   . Atypical chest pain 07/11/2018  . Essential hypertension 07/11/2018  . Gestational hypertension 07/21/2011  . Hypertension   . Maternal iron deficiency anemia complicating pregnancy 07/22/2011  . Postpartum care following cesarean delivery (10/11) 07/21/2011  . Pregnancy induced hypertension     Past Surgical History:  Procedure Laterality Date  . CESAREAN SECTION  07/21/2011   Procedure: CESAREAN SECTION;  Surgeon: Serita Kyle, MD;   Location: [facility];  Service: Gynecology;  Laterality: N/A;  . LEEP       Current Outpatient Medications  Medication Sig Dispense Refill  . amLODipine (NORVASC) 5 MG tablet Take 5 mg by mouth daily.    . hydrochlorothiazide (HYDRODIURIL) 25 MG tablet Take 12.5 mg by mouth daily.    Marland Kitchen levocetirizine (XYZAL) 5 MG tablet Take 5 mg by mouth as needed.     . norethindrone (MICRONOR,CAMILA,ERRIN) 0.35 MG tablet Take 1 tablet by mouth daily.     No current facility-administered medications for this visit.     Allergies:   Lisinopril    Social History:  The patient  reports that she quit smoking about 7 years ago. She has never used smokeless tobacco. She reports that she drinks alcohol. She reports that she does not use drugs.   Family History:  The patient's family history includes Cancer in her sister; Diabetes in her father; Gout in her mother; Heart failure in her mother; Hypertension in her brother and mother; Stroke in her mother.    ROS:  Please see the history of present illness.   Otherwise, review of systems are positive for none.   All other systems are reviewed and negative.    PHYSICAL EXAM: VS:  BP 134/84   Pulse 68   Ht 5' 9.5" (1.765 m)   Wt 215 lb (97.5 kg)   SpO2 98%   BMI  31.29 kg/m  , BMI Body mass index is 31.29 kg/m. GENERAL:  Well appearing HEENT:  Pupils equal round and reactive, fundi not visualized, oral mucosa unremarkable NECK:  No jugular venous distention, waveform within normal limits, carotid upstroke brisk and symmetric, no bruits LUNGS:  Clear to auscultation bilaterally HEART:  RRR.  PMI not displaced or sustained,S1 and S2 within normal limits, no S3, no S4, no clicks, no rubs, no murmurs ABD:  Flat, positive bowel sounds normal in frequency in pitch, no bruits, no rebound, no guarding, no midline pulsatile mass, no hepatomegaly, no splenomegaly EXT:  2 plus pulses throughout, no edema, no cyanosis no clubbing SKIN:  No rashes no nodules NEURO:   Cranial nerves II through XII grossly intact, motor grossly intact throughout PSYCH:  Cognitively intact, oriented to person place and time   EKG:  EKG is ordered today. The ekg ordered today demonstrates sinus rhythm.  Rate 70 bpm   Recent Labs: No results found for requested labs within last 8760 hours.    Lipid Panel No results found for: CHOL, TRIG, HDL, CHOLHDL, VLDL, LDLCALC, LDLDIRECT    Wt Readings from Last 3 Encounters:  07/11/18 215 lb (97.5 kg)  08/05/14 225 lb 8 oz (102.3 kg)  07/19/11 231 lb 4.8 oz (104.9 kg)      ASSESSMENT AND PLAN:  # Atypical chest pain:  Symptoms are very atypical and never with exertion. She has no risk factors other than hypertension.  We discussed her low risk for CAD and offered both ETT and coronary calcium score.  She declined both.  # CV Disease Prevention: Recommended that she work on increasing her exercise to at least 150 minutes per week.  She expressed understanding.  # Hypertension: Continue amlodipine and HCTZ.   Current medicines are reviewed at length with the patient today.  The patient does not have concerns regarding medicines.  The following changes have been made:  no change    Disposition:   FU with Johnhenry Tippin C. Duke Salvia, MD, [facility] in 6 months     Signed, Izzabelle Bouley C. Duke Salvia, MD, [facility]  07/11/2018 6:53 PM    [facility]

## 2018-07-11 NOTE — Patient Instructions (Signed)
Medication Instructions:  Your physician recommends that you continue on your current medications as directed. Please refer to the Current Medication list given to you today.  Labwork: NONE  Testing/Procedures: NONE  Follow-Up: Your physician wants you to follow-up in: 6 MONTHS  You will receive a reminder letter in the mail two months in advance. If you don't receive a letter, please call our office to schedule the follow-up appointment.  Any Other Special Instructions Will Be Listed Below (If Applicable).  TRY TO EXERCISE 150 MINUTES EACH WEEK   If you need a refill on your cardiac medications before your next appointment, please call your pharmacy.  

## 2018-08-14 DIAGNOSIS — R946 Abnormal results of thyroid function studies: Secondary | ICD-10-CM | POA: Diagnosis not present

## 2018-08-15 DIAGNOSIS — L700 Acne vulgaris: Secondary | ICD-10-CM | POA: Diagnosis not present

## 2018-10-12 DIAGNOSIS — J069 Acute upper respiratory infection, unspecified: Secondary | ICD-10-CM | POA: Diagnosis not present

## 2019-02-05 ENCOUNTER — Telehealth: Payer: Self-pay | Admitting: *Deleted

## 2019-02-05 NOTE — Telephone Encounter (Signed)
Left message to call back regarding appointment tomorrow

## 2019-02-06 ENCOUNTER — Other Ambulatory Visit: Payer: Self-pay

## 2019-02-06 ENCOUNTER — Telehealth: Payer: BLUE CROSS/BLUE SHIELD | Admitting: Cardiovascular Disease

## 2019-02-06 NOTE — Telephone Encounter (Signed)
Left message to call back  

## 2019-02-13 NOTE — Telephone Encounter (Signed)
Patient never called back.

## 2019-07-10 ENCOUNTER — Telehealth: Payer: Self-pay | Admitting: Cardiovascular Disease

## 2019-07-10 NOTE — Telephone Encounter (Signed)
New Message  Pt c/o BP issue: STAT if pt c/o blurred vision, one-sided weakness or slurred speech  1. What are your last 5 BP readings? 138/88, 130/80,  patient doesn't have any other readings  2. Are you having any other symptoms (ex. Dizziness, headache, blurred vision, passed out)? Yes, headaches  3. What is your BP issue? Patient states that blood pressure is elevated and she is having headaches everyday since last week.    Scheduled with Kerin Ransom PA on 08/01/19 at 8 am.

## 2019-07-10 NOTE — Telephone Encounter (Signed)
Spoke with patient and she has had an intermittent headache over the last four days. Yesterday and today she checked her blood pressure and SBP was in 130's. Patient denies any fever, chills, cough, or body aches. Advised patient this blood pressure should not be causing headache, continue to monitor and if SBP remains above 130 to call back. Has appointment with PCP tomorrow, will keep that visit

## 2019-07-11 DIAGNOSIS — I10 Essential (primary) hypertension: Secondary | ICD-10-CM | POA: Diagnosis not present

## 2019-07-11 DIAGNOSIS — R519 Headache, unspecified: Secondary | ICD-10-CM | POA: Diagnosis not present

## 2019-07-11 DIAGNOSIS — E663 Overweight: Secondary | ICD-10-CM | POA: Diagnosis not present

## 2019-07-15 DIAGNOSIS — E039 Hypothyroidism, unspecified: Secondary | ICD-10-CM | POA: Diagnosis not present

## 2019-07-15 DIAGNOSIS — R519 Headache, unspecified: Secondary | ICD-10-CM | POA: Diagnosis not present

## 2019-07-31 ENCOUNTER — Other Ambulatory Visit: Payer: Self-pay

## 2019-07-31 DIAGNOSIS — Z20822 Contact with and (suspected) exposure to covid-19: Secondary | ICD-10-CM

## 2019-08-01 ENCOUNTER — Ambulatory Visit: Payer: BC Managed Care – PPO | Admitting: Cardiology

## 2019-08-01 LAB — NOVEL CORONAVIRUS, NAA: SARS-CoV-2, NAA: NOT DETECTED

## 2019-08-13 ENCOUNTER — Other Ambulatory Visit: Payer: Self-pay | Admitting: *Deleted

## 2019-08-13 DIAGNOSIS — Z20822 Contact with and (suspected) exposure to covid-19: Secondary | ICD-10-CM

## 2019-08-14 DIAGNOSIS — E039 Hypothyroidism, unspecified: Secondary | ICD-10-CM | POA: Diagnosis not present

## 2019-08-14 LAB — NOVEL CORONAVIRUS, NAA: SARS-CoV-2, NAA: NOT DETECTED

## 2019-09-09 DIAGNOSIS — E039 Hypothyroidism, unspecified: Secondary | ICD-10-CM | POA: Diagnosis not present

## 2019-09-20 ENCOUNTER — Other Ambulatory Visit: Payer: Self-pay | Admitting: Internal Medicine

## 2019-09-20 ENCOUNTER — Ambulatory Visit
Admission: RE | Admit: 2019-09-20 | Discharge: 2019-09-20 | Disposition: A | Discharge status: Home / Self Care | Payer: BC Managed Care – PPO | Source: Ambulatory Visit | Attending: Internal Medicine | Admitting: Internal Medicine

## 2019-09-20 DIAGNOSIS — E041 Nontoxic single thyroid nodule: Secondary | ICD-10-CM

## 2019-09-20 DIAGNOSIS — E042 Nontoxic multinodular goiter: Secondary | ICD-10-CM | POA: Diagnosis not present

## 2019-09-20 DIAGNOSIS — M542 Cervicalgia: Secondary | ICD-10-CM | POA: Diagnosis not present

## 2019-09-27 DIAGNOSIS — M542 Cervicalgia: Secondary | ICD-10-CM | POA: Diagnosis not present

## 2020-04-20 ENCOUNTER — Ambulatory Visit: Payer: BC Managed Care – PPO | Admitting: Cardiovascular Disease

## 2020-06-04 ENCOUNTER — Ambulatory Visit: Payer: Self-pay | Admitting: Cardiovascular Disease

## 2021-02-25 ENCOUNTER — Encounter: Payer: Self-pay | Admitting: Podiatry

## 2021-02-25 ENCOUNTER — Ambulatory Visit: Payer: No Typology Code available for payment source | Admitting: Podiatry

## 2021-02-25 ENCOUNTER — Ambulatory Visit (INDEPENDENT_AMBULATORY_CARE_PROVIDER_SITE_OTHER): Payer: No Typology Code available for payment source

## 2021-02-25 ENCOUNTER — Other Ambulatory Visit: Payer: Self-pay

## 2021-02-25 DIAGNOSIS — M7662 Achilles tendinitis, left leg: Secondary | ICD-10-CM

## 2021-02-25 DIAGNOSIS — M7661 Achilles tendinitis, right leg: Secondary | ICD-10-CM | POA: Diagnosis not present

## 2021-02-25 DIAGNOSIS — E039 Hypothyroidism, unspecified: Secondary | ICD-10-CM | POA: Insufficient documentation

## 2021-02-25 DIAGNOSIS — M722 Plantar fascial fibromatosis: Secondary | ICD-10-CM | POA: Diagnosis not present

## 2021-02-25 MED ORDER — METHYLPREDNISOLONE 4 MG PO TBPK: ORAL_TABLET | ORAL | 0 refills | Status: DC

## 2021-02-25 MED ORDER — TRIAMCINOLONE ACETONIDE 40 MG/ML IJ SUSP
40.0000 mg | Freq: Once | INTRAMUSCULAR | Status: AC
  Administered 2021-02-25: 40 mg

## 2021-02-25 MED ORDER — MELOXICAM 15 MG PO TABS: 15.0000 mg | ORAL_TABLET | Freq: Every day | ORAL | 3 refills | Status: AC

## 2021-02-25 NOTE — Patient Instructions (Signed)

## 2021-02-28 NOTE — Progress Notes (Signed)
Subjective:  Patient ID: Jill Alexanders, female    DOB: 1980/11/14,  MRN: [id number] HPI Chief Complaint  Patient presents with  . Foot Pain    Posterior heel bilateral (R>L) - aching, very sharp (especially in the right) x 2 months, AM pain, no treatment  . New Patient (Initial Visit)    40 y.o. female presents with the above complaint.   ROS: Denies fever chills nausea vomiting muscle aches pains calf pain back pain chest pain shortness of breath.  Past Medical History:  Diagnosis Date  . Abnormal Pap smear   . Atypical chest pain 07/11/2018  . Essential hypertension 07/11/2018  . Gestational hypertension 07/21/2011  . Hypertension   . Maternal iron deficiency anemia complicating pregnancy 07/22/2011  . Postpartum care following cesarean delivery (10/11) 07/21/2011  . Pregnancy induced hypertension    Past Surgical History:  Procedure Laterality Date  . CESAREAN SECTION  07/21/2011   Procedure: CESAREAN SECTION;  Surgeon: Serita Kyle, MD;  Location: [facility];  Service: Gynecology;  Laterality: N/A;  . LEEP      Current Outpatient Medications:  .  meloxicam (MOBIC) 15 MG tablet, Take 1 tablet (15 mg total) by mouth daily., Disp: 30 tablet, Rfl: 3 .  methylPREDNISolone (MEDROL DOSEPAK) 4 MG TBPK tablet, 6 day dose pack - take as directed, Disp: 21 tablet, Rfl: 0 .  amLODipine (NORVASC) 5 MG tablet, Take 5 mg by mouth daily., Disp: , Rfl:  .  hydrochlorothiazide (HYDRODIURIL) 25 MG tablet, Take 12.5 mg by mouth daily., Disp: , Rfl:  .  levothyroxine (SYNTHROID) 125 MCG tablet, Take 125 mcg by mouth every morning., Disp: , Rfl:   Allergies  Allergen Reactions  . Lisinopril Cough   Review of Systems Objective:  There were no vitals filed for this visit.  General: Well developed, nourished, in no acute distress, alert and oriented x3   Dermatological: Skin is warm, dry and supple bilateral. Nails x 10 are well maintained; remaining integument appears  unremarkable at this time. There are no open sores, no preulcerative lesions, no rash or signs of infection present.  Vascular: Dorsalis Pedis artery and Posterior Tibial artery pedal pulses are 2/4 bilateral with immedate capillary fill time. Pedal hair growth present. No varicosities and no lower extremity edema present bilateral.   Neruologic: Grossly intact via light touch bilateral. Vibratory intact via tuning fork bilateral. Protective threshold with Semmes Wienstein monofilament intact to all pedal sites bilateral. Patellar and Achilles deep tendon reflexes 2+ bilateral. No Babinski or clonus noted bilateral.   Musculoskeletal: No gross boney pedal deformities bilateral. No pain, crepitus, or limitation noted with foot and ankle range of motion bilateral. Muscular strength 5/5 in all groups tested bilateral.  Pain on palpation medial calcaneal tubercles bilaterally.  She also has some tenderness on medial lateral compression of the calcaneus around the plantar tubercles.  Also has some tenderness on palpation and fluctuance along the posterior aspect of the calcaneus at the Achilles tendon insertion site.  Gait: Unassisted, Nonantalgic.    Radiographs:  Radiographs taken today demonstrate osseously mature individual soft tissue increase in density plantar fascial cannula insertion sites as well as some thickening of the Achilles tendon at its insertion site.  Otherwise soft tissue margins appear to be relatively normal no acute osseous findings.  Assessment & Plan:   Assessment: Planter fasciitis with compensatory Achilles tendinitis.  Plan: Discussed etiology pathology and surgical therapies.  Start her on methylprednisolone to be followed by meloxicam.  Posterior and  plantar fascial braces bilateral injected the bilateral heels 20 mg Kenalog 5 mg Marcaine point of maximal tenderness and I will follow-up with her in 1 month.       Keiandre Cygan T. Winfred, [place]

## 2021-04-01 ENCOUNTER — Encounter: Payer: Self-pay | Admitting: Podiatry

## 2021-04-01 ENCOUNTER — Other Ambulatory Visit: Payer: Self-pay

## 2021-04-01 ENCOUNTER — Ambulatory Visit: Payer: No Typology Code available for payment source | Admitting: Podiatry

## 2021-04-01 DIAGNOSIS — M722 Plantar fascial fibromatosis: Secondary | ICD-10-CM | POA: Diagnosis not present

## 2021-04-01 DIAGNOSIS — M7661 Achilles tendinitis, right leg: Secondary | ICD-10-CM | POA: Diagnosis not present

## 2021-04-01 DIAGNOSIS — M7662 Achilles tendinitis, left leg: Secondary | ICD-10-CM | POA: Diagnosis not present

## 2021-04-01 MED ORDER — TRIAMCINOLONE ACETONIDE 40 MG/ML IJ SUSP
20.0000 mg | Freq: Once | INTRAMUSCULAR | Status: AC
  Administered 2021-04-01: 20 mg

## 2021-04-01 MED ORDER — DEXAMETHASONE SODIUM PHOSPHATE 120 MG/30ML IJ SOLN
2.0000 mg | Freq: Once | INTRAMUSCULAR | Status: AC
  Administered 2021-04-01: 2 mg via INTRA_ARTICULAR

## 2021-04-04 NOTE — Progress Notes (Signed)
She presents today for follow-up of her bilateral plantar fasciitis states that the left heel is feeling fine the right 1 is better but is still sore.  Objective: Vital signs are stable alert oriented x3.  She has pain on palpation of the right posterior aspect of her heel at the calcaneal tuberosity superiorly at the Achilles tendon insertion site there is an area of fluctuance most likely a adventitial bursa or bursitis.  She also has pain on palpation medial calcaneal tubercle of the right heel.  Assessment: Insertional Achilles tendinitis bursitis right resolving Planter fasciitis right.  Plan: Discussed etiology pathology conservative versus surgical therapies.  I injected 2 mg of dexamethasone to the Achilles bursitis posteriorly making sure not to inject into the tendon itself.  Also injected 20 mg Kenalog 5 mg Marcaine point of maximal tenderness of the right heel at the plantar fashion calcaneal insertion site.  Follow-up with her in 1 month

## 2021-05-04 ENCOUNTER — Ambulatory Visit: Payer: No Typology Code available for payment source | Admitting: Podiatry

## 2021-12-20 ENCOUNTER — Other Ambulatory Visit: Payer: Self-pay

## 2021-12-20 ENCOUNTER — Ambulatory Visit (INDEPENDENT_AMBULATORY_CARE_PROVIDER_SITE_OTHER): Payer: No Typology Code available for payment source | Admitting: Cardiovascular Disease

## 2021-12-20 ENCOUNTER — Encounter (HOSPITAL_BASED_OUTPATIENT_CLINIC_OR_DEPARTMENT_OTHER): Payer: Self-pay | Admitting: Cardiovascular Disease

## 2021-12-20 DIAGNOSIS — E039 Hypothyroidism, unspecified: Secondary | ICD-10-CM | POA: Diagnosis not present

## 2021-12-20 DIAGNOSIS — I10 Essential (primary) hypertension: Secondary | ICD-10-CM | POA: Diagnosis not present

## 2021-12-20 DIAGNOSIS — R0789 Other chest pain: Secondary | ICD-10-CM

## 2021-12-20 DIAGNOSIS — E669 Obesity, unspecified: Secondary | ICD-10-CM | POA: Diagnosis not present

## 2021-12-20 HISTORY — DX: Obesity, unspecified: E66.9

## 2021-12-20 MED ORDER — METOPROLOL TARTRATE 25 MG PO TABS: ORAL_TABLET | ORAL | 0 refills | Status: DC

## 2021-12-20 NOTE — Assessment & Plan Note (Signed)
She has been struggling to lose weight.  We discussed the PREP program and she is interested in enrolling.  We will refer her ?

## 2021-12-20 NOTE — Patient Instructions (Signed)
Medication Instructions:  TAKE METOPROLOL 25 MG 1 TABLET 2 HOURS PRIOR TO CT   *If you need a refill on your cardiac medications before your next appointment, please call your pharmacy*  Lab Work: BMET 1 WEEK PRIOR TO CT   If you have labs (blood work) drawn today and your tests are completely normal, you will receive your results only by: [place] (if you have MyChart) OR A paper copy in the mail If you have any lab test that is abnormal or we need to change your treatment, we will call you to review the results.  Testing/Procedures: Your physician has requested that you have cardiac CT. Cardiac computed tomography (CT) is a painless test that uses an x-ray machine to take clear, detailed pictures of your heart. For further information please visit HugeFiesta.tn. Please follow instruction sheet as given.  Follow-Up: At [facility], you and your health needs are our priority.  As part of our continuing mission to provide you with exceptional heart care, we have created designated Provider Care Teams.  These Care Teams include your primary Cardiologist (physician) and Advanced Practice Providers (APPs -  Physician Assistants and Nurse Practitioners) who all work together to provide you with the care you need, when you need it.  We recommend signing up for the patient portal called "MyChart".  Sign up information is provided on this After Visit Summary.  MyChart is used to connect with patients for Virtual Visits (Telemedicine).  Patients are able to view lab/test results, encounter notes, upcoming appointments, etc.  Non-urgent messages can be sent to your provider as well.   To learn more about what you can do with MyChart, go to [email or web address].    Your next appointment:   3 month(s)  The format for your next appointment:   In Person  Provider:   Skeet Latch, MD{  Other Instructions    Your cardiac CT will be scheduled at one of the below locations:    [facility] [address] [place], [place] [address] [phone]  [facility] [address] [place], [place] [address] [phone]  If scheduled at [facility], please arrive at the [facility] Entrance (Entrance C2) of [facility] 30 minutes prior to test start time. You can use the FREE valet parking offered at entrance C (encouraged to control the heart rate for the test)  Proceed to the [facility] Radiology Department (first floor) to check-in and test prep.  All radiology patients and guests should use entrance C2 at [facility], accessed from [facility], even though the hospital's physical address listed is [address].    If scheduled at [facility], please arrive 15 mins early for check-in and test prep.  Please follow these instructions carefully (unless otherwise directed):  Hold all erectile dysfunction medications at least 3 days (72 hrs) prior to test.  On the Night Before the Test: Be sure to Drink plenty of water. Do not consume any caffeinated/decaffeinated beverages or chocolate 12 hours prior to your test. Do not take any antihistamines 12 hours prior to your test. If the patient has contrast allergy: Patient will need a prescription for Prednisone and very clear instructions (as follows): Prednisone 50 mg - take 13 hours prior to test Take another Prednisone 50 mg 7 hours prior to test Take another Prednisone 50 mg 1 hour prior to test Take Benadryl 50 mg 1  hour prior to test Patient must complete all four doses of above prophylactic medications. Patient will need a ride after test due to Benadryl.  On the Day of the Test: Drink plenty of water until 1 hour prior to the test. Do not eat any food 4 hours prior to the test. You may take your regular medications prior to the test.  Take metoprolol  (Lopressor) two hours prior to test. HOLD Furosemide/Hydrochlorothiazide morning of the test. FEMALES- please wear underwire-free bra if available, avoid dresses & tight clothing      After the Test: Drink plenty of water. After receiving IV contrast, you may experience a mild flushed feeling. This is normal. On occasion, you may experience a mild rash up to 24 hours after the test. This is not dangerous. If this occurs, you can take Benadryl 25 mg and increase your fluid intake. If you experience trouble breathing, this can be serious. If it is severe call 911 IMMEDIATELY. If it is mild, please call our office. If you take any of these medications: Glipizide/Metformin, Avandament, Glucavance, please do not take 48 hours after completing test unless otherwise instructed.  We will call to schedule your test 2-4 weeks out understanding that some insurance companies will need an authorization prior to the service being performed.   For non-scheduling related questions, please contact the cardiac imaging nurse navigator should you have any questions/concerns: Marchia Bond, Cardiac Imaging Nurse Navigator Gordy Clement, Cardiac Imaging Nurse Navigator  Heart and Vascular Services Direct Office Dial: [phone]   For scheduling needs, including cancellations and rescheduling, please call [place], [phone].  Cardiac CT Angiogram A cardiac CT angiogram is a procedure to look at the heart and the area around the heart. It may be done to help find the cause of chest pains or other symptoms of heart disease. During this procedure, a substance called contrast dye is injected into the blood vessels in the area to be checked. A large X-ray machine, called a CT scanner, then takes detailed pictures of the heart and the surrounding area. The procedure is also sometimes called a coronary CT angiogram, coronary artery scanning, or CTA. A cardiac CT angiogram allows the health care provider to  see how well blood is flowing to and from the heart. The health care provider will be able to see if there are any problems, such as: Blockage or narrowing of the coronary arteries in the heart. Fluid around the heart. Signs of weakness or disease in the muscles, valves, and tissues of the heart. Tell a health care provider about: Any allergies you have. This is especially important if you have had a previous allergic reaction to contrast dye. All medicines you are taking, including vitamins, herbs, eye drops, creams, and over-the-counter medicines. Any blood disorders you have. Any surgeries you have had. Any medical conditions you have. Whether you are pregnant or may be pregnant. Any anxiety disorders, chronic pain, or other conditions you have that may increase your stress or prevent you from lying still. What are the risks? Generally, this is a safe procedure. However, problems may occur, including: Bleeding. Infection. Allergic reactions to medicines or dyes. Damage to other structures or organs. Kidney damage from the contrast dye that is used. Increased risk of cancer from radiation exposure. This risk is low. Talk with your health care provider about: The risks and benefits of testing. How you can receive the lowest dose of radiation. What happens before the procedure? Wear comfortable clothing and remove any  jewelry, glasses, dentures, and hearing aids. Follow instructions from your health care provider about eating and drinking. This may include: For 12 hours before the procedure -- avoid caffeine. This includes tea, coffee, soda, energy drinks, and diet pills. Drink plenty of water or other fluids that do not have caffeine in them. Being well hydrated can prevent complications. For 4-6 hours before the procedure -- stop eating and drinking. The contrast dye can cause nausea, but this is less likely if your stomach is empty. Ask your health care provider about changing or  stopping your regular medicines. This is especially important if you are taking diabetes medicines, blood thinners, or medicines to treat problems with erections (erectile dysfunction). What happens during the procedure?  Hair on your chest may need to be removed so that small sticky patches called electrodes can be placed on your chest. These will transmit information that helps to monitor your heart during the procedure. An IV will be inserted into one of your veins. You might be given a medicine to control your heart rate during the procedure. This will help to ensure that good images are obtained. You will be asked to lie on an exam table. This table will slide in and out of the CT machine during the procedure. Contrast dye will be injected into the IV. You might feel warm, or you may get a metallic taste in your mouth. You will be given a medicine called nitroglycerin. This will relax or dilate the arteries in your heart. The table that you are lying on will move into the CT machine tunnel for the scan. The person running the machine will give you instructions while the scans are being done. You may be asked to: Keep your arms above your head. Hold your breath. Stay very still, even if the table is moving. When the scanning is complete, you will be moved out of the machine. The IV will be removed. The procedure may vary among health care providers and hospitals. What can I expect after the procedure? After your procedure, it is common to have: A metallic taste in your mouth from the contrast dye. A feeling of warmth. A headache from the nitroglycerin. Follow these instructions at home: Take over-the-counter and prescription medicines only as told by your health care provider. If you are told, drink enough fluid to keep your urine pale yellow. This will help to flush the contrast dye out of your body. Most people can return to their normal activities right after the procedure. Ask your  health care provider what activities are safe for you. It is up to you to get the results of your procedure. Ask your health care provider, or the department that is doing the procedure, when your results will be ready. Keep all follow-up visits as told by your health care provider. This is important. Contact a health care provider if: You have any symptoms of allergy to the contrast dye. These include: Shortness of breath. Rash or hives. A racing heartbeat. Summary A cardiac CT angiogram is a procedure to look at the heart and the area around the heart. It may be done to help find the cause of chest pains or other symptoms of heart disease. During this procedure, a large X-ray machine, called a CT scanner, takes detailed pictures of the heart and the surrounding area after a contrast dye has been injected into blood vessels in the area. Ask your health care provider about changing or stopping your regular medicines before the  procedure. This is especially important if you are taking diabetes medicines, blood thinners, or medicines to treat erectile dysfunction. If you are told, drink enough fluid to keep your urine pale yellow. This will help to flush the contrast dye out of your body. This information is not intended to replace advice given to you by your health care provider. Make sure you discuss any questions you have with your health care provider. Document Revised: 06/09/2021 Document Reviewed: 05/22/2019 Elsevier Patient Education  2022 [employer].

## 2021-12-20 NOTE — Assessment & Plan Note (Signed)
Blood pressure is very well controlled on her current regimen of amlodipine and HCTZ.  No changes. ?

## 2021-12-20 NOTE — Assessment & Plan Note (Signed)
She is working with Dr. Nehemiah Settle to adjust her levothyroxine. ?

## 2021-12-20 NOTE — Assessment & Plan Note (Signed)
Sabrina [name] continues to have chest pain.  Her symptoms are not clearly exertional, though she is not getting any exercise.  We will get a coronary CTA to both rule out obstructive coronary disease and to better risk stratify her going forward.   ?

## 2021-12-20 NOTE — Progress Notes (Signed)
? ?Cardiology Office Note ? ?Date:  12/20/2021  ? ?ID:  Sabrina [name], DOB [birth year], MRN [id number] ? ?PCP:  Sabrina Dills, MD  ?Cardiologist:   Sabrina Si, MD  ? ?No chief complaint on file. ? ?  ?History of Present Illness: ?Sabrina [name] is a 41 y.o. female with hypertension who is being seen today to reestablish care. She was initially seen 07/11/2018 for the evaluation of chest pressure at the request of Sabrina Dills, MD.  For the preceding two years she had noted intermitted chest pressure. Her symptoms were very atypical. We offered an ETT and a calcium score but she declined both. We recommended increasing her exercise. ? ?Today, she appears well. At home her blood pressure is typically similar to today's clinical reading 110/86. For the past month she has suffered from inconsistent, left sided chest pressure. This is also described as a "squeezing" pain associated with some sharp, burning sensations in her chest. In the past couple of days she has noticed a different right-sided pain typically occurring with deep inspirations, coughing, or sneezing. Additionally she complains of some numbness in her bilateral arms. She states this is likely related to her hypothyroidism  Recently her levothyroxine was reduced. Her exercising has been minimal lately. She endorses shortness of breath with heavy activity, but she also attributes this to her weight and deconditioning. She denies any palpitations, or peripheral edema. No lightheadedness, headaches, syncope, orthopnea, or PND. ? ? ?Past Medical History:  ?Diagnosis Date  ? Abnormal Pap smear   ? Atypical chest pain 07/11/2018  ? Essential hypertension 07/11/2018  ? Gestational hypertension 07/21/2011  ? Hypertension   ? Maternal iron deficiency anemia complicating pregnancy 07/22/2011  ? Obesity (BMI 35.0-39.9 without comorbidity) 12/20/2021  ? Postpartum care following cesarean delivery (10/11) 07/21/2011  ? Pregnancy induced hypertension    ? ? ?Past Surgical History:  ?Procedure Laterality Date  ? CESAREAN SECTION  07/21/2011  ? Procedure: CESAREAN SECTION;  Surgeon: Serita Kyle, MD;  Location: [facility];  Service: Gynecology;  Laterality: N/A;  ? LEEP    ? ? ? ?Current Outpatient Medications  ?Medication Sig Dispense Refill  ? amLODipine (NORVASC) 5 MG tablet Take 5 mg by mouth daily.    ? hydrochlorothiazide (HYDRODIURIL) 12.5 MG tablet Take 12.5 mg by mouth daily.    ? levothyroxine (SYNTHROID) 112 MCG tablet Take 112 mcg by mouth every morning.    ? meloxicam (MOBIC) 15 MG tablet Take 1 tablet (15 mg total) by mouth daily. 30 tablet 3  ? metoprolol tartrate (LOPRESSOR) 25 MG tablet TAKE 1 TABLET 2 HOURS PRIOR TO CT 1 tablet 0  ? ?No current facility-administered medications for this visit.  ? ? ?Allergies:   Lisinopril  ? ? ?Social History:  The patient  reports that she quit smoking about 10 years ago. Her smoking use included cigarettes. She has never used smokeless tobacco. She reports current alcohol use. She reports that she does not use drugs.  ? ?Family History:  The patient's family history includes Cancer in her sister; Diabetes in her father; Gout in her mother; Heart failure in her mother; Hypertension in her brother and mother; Stroke in her mother.  ? ? ?ROS:   ?Please see the history of present illness. ?(+) Chest pain ?(+) Bilateral UE numbness ?(+) Shortness of breath. ?All other systems are reviewed and negative.  ? ? ?PHYSICAL EXAM: ?VS:  BP 110/86 (BP Location: Left Arm, Patient Position: Sitting, Cuff Size: Large)  Pulse 62   Ht 5' 9.5" (1.765 m)   Wt 246 lb 3.2 oz (111.7 kg)   BMI 35.84 kg/m?  , BMI Body mass index is 35.84 kg/m?. ?GENERAL:  Well appearing ?HEENT:  Pupils equal round and reactive, fundi not visualized, oral mucosa unremarkable ?NECK:  No jugular venous distention, waveform within normal limits, carotid upstroke brisk and symmetric, no bruits ?LUNGS:  Clear to auscultation bilaterally ?HEART:  RRR.   PMI not displaced or sustained,S1 and S2 within normal limits, no S3, no S4, no clicks, no rubs, no murmurs ?ABD:  Flat, positive bowel sounds normal in frequency in pitch, no bruits, no rebound, no guarding, no midline pulsatile mass, no hepatomegaly, no splenomegaly ?EXT:  2 plus pulses throughout, no edema, no cyanosis no clubbing ?SKIN:  No rashes no nodules ?NEURO:  Cranial nerves II through XII grossly intact, motor grossly intact throughout ?PSYCH:  Cognitively intact, oriented to person place and time ? ? ?EKG:  EKG is personally reviewed. ?12/20/2021: Sinus rhythm. Rate 62 bpm. ?07/11/2018: sinus rhythm.  Rate 70 bpm ? ? ?Recent Labs: ?No results found for requested labs within last 8760 hours.  ? ?Lipid Panel ?No results found for: CHOL, TRIG, HDL, CHOLHDL, VLDL, LDLCALC, LDLDIRECT ?  ? ?Wt Readings from Last 3 Encounters:  ?12/20/21 246 lb 3.2 oz (111.7 kg)  ?07/11/18 215 lb (97.5 kg)  ?08/05/14 225 lb 8 oz (102.3 kg)  ?  ? ?ASSESSMENT AND PLAN: ? ?Essential hypertension ?Blood pressure is very well controlled on her current regimen of amlodipine and HCTZ.  No changes. ? ?Atypical chest pain ?[name] continues to have chest pain.  Her symptoms are not clearly exertional, though she is not getting any exercise.  We will get a coronary CTA to both rule out obstructive coronary disease and to better risk stratify her going forward.   ? ?Obesity (BMI 35.0-39.9 without comorbidity) ?She has been struggling to lose weight.  We discussed the PREP program and she is interested in enrolling.  We will refer her ? ?Hypothyroidism ?She is working with Dr. Nehemiah Settle to adjust her levothyroxine. ? ? ?Current medicines are reviewed at length with the patient today.  The patient does not have concerns regarding medicines. ? ?The following changes have been made:  no change ? ? ?Disposition:   FU with Carely Nappier C. Duke Salvia, MD, [facility] in 2-3 months.  ? ?I,Mathew Stumpf,acting as a Neurosurgeon for Sabrina Si, MD.,have  documented all relevant documentation on the behalf of Sabrina Si, MD,as directed by  Sabrina Si, MD while in the presence of Sabrina Si, MD. ? ?I, Tristina Sahagian C. Duke Salvia, MD have reviewed all documentation for this visit.  The documentation of the exam, diagnosis, procedures, and orders on 12/20/2021 are all accurate and complete. ? ? ?Signed, ?Acsa Estey C. Duke Salvia, MD, [facility]  ?12/20/2021 5:52 PM    ?[facility] ?

## 2021-12-24 ENCOUNTER — Telehealth: Payer: Self-pay

## 2021-12-24 NOTE — Telephone Encounter (Signed)
Call to patient reference PREP referral ?Lives in [place] and needs and evening class.  ?We do not currently have an evening class in [facility]  but do have [employer] class. Next one will be June.  ?Advised will keep her on both lists in the event another class starts sooner.  ?

## 2021-12-28 LAB — BASIC METABOLIC PANEL
BUN/Creatinine Ratio: 11 (ref 9–23)
BUN: 9 mg/dL (ref 6–24)
CO2: 26 mmol/L (ref 20–29)
Calcium: 9.7 mg/dL (ref 8.7–10.2)
Chloride: 102 mmol/L (ref 96–106)
Creatinine, Ser: 0.81 mg/dL (ref 0.57–1.00)
Glucose: 75 mg/dL (ref 70–99)
Potassium: 3.9 mmol/L (ref 3.5–5.2)
Sodium: 142 mmol/L (ref 134–144)
eGFR: 94 mL/min/{1.73_m2} (ref >59)

## 2021-12-30 ENCOUNTER — Telehealth (HOSPITAL_COMMUNITY): Payer: Self-pay | Admitting: *Deleted

## 2021-12-30 NOTE — Telephone Encounter (Signed)
Reaching out to patient to offer assistance regarding upcoming cardiac imaging study; pt verbalizes understanding of appt date/time, parking situation and where to check in, pre-test NPO status and medications ordered, and verified current allergies; name and call back number provided for further questions should they arise ? ?Larey Brick RN Navigator Cardiac Imaging ?[facility] ?[phone] office ?[phone] cell ? ?Patient to take 25mg  metoprolol tartrate two hours prior to her cardiac CT Scan. She is aware to arrive at 4pm for her 4:30pm scan. ?

## 2022-01-03 ENCOUNTER — Ambulatory Visit (HOSPITAL_COMMUNITY)
Admission: RE | Admit: 2022-01-03 | Discharge: 2022-01-03 | Disposition: A | Discharge status: Home / Self Care | Payer: No Typology Code available for payment source | Source: Ambulatory Visit | Attending: Cardiovascular Disease | Admitting: Cardiovascular Disease

## 2022-01-03 ENCOUNTER — Other Ambulatory Visit: Payer: Self-pay

## 2022-01-03 DIAGNOSIS — R0789 Other chest pain: Secondary | ICD-10-CM | POA: Insufficient documentation

## 2022-01-03 DIAGNOSIS — E669 Obesity, unspecified: Secondary | ICD-10-CM | POA: Insufficient documentation

## 2022-01-03 DIAGNOSIS — I10 Essential (primary) hypertension: Secondary | ICD-10-CM | POA: Insufficient documentation

## 2022-01-03 IMAGING — CT CT HEART MORP W/ CTA COR W/ SCORE W/ CA W/CM &/OR W/O CM
4 of 7 series · 8 of 20 positions shown, 9 images · IV contrast (APPLIED)
Comparison: None.
COMPARISON: None.

Addendum:
EXAM:
OVER-READ INTERPRETATION  CT CHEST

The following report is an over-read performed by radiologist Dr.
Chon Pearson [REDACTED] on 01/03/2022. This over-read
does not include interpretation of cardiac or coronary anatomy or
pathology. The coronary CTA interpretation by the cardiologist is
attached.
CLINICAL DATA: Chest pain
Cardiac CTA
MEDICATIONS:
Sub lingual nitro. 4mg x 2
TECHNIQUE: The patient was scanned on a Siemens [REDACTED]ice scanner. Gantry
rotation speed was 250 msecs. Collimation was 0.6 mm. A 100 kV
prospective scan was triggered in the ascending thoracic aorta at
35-75% of the R-R interval. Average HR during the scan was 60 bpm.
The 3D data set was interpreted on a dedicated work station using
MPR, MIP and VRT modes. A total of 80cc of contrast was used.

[Series 6: ts syst sharp · axial · 0.33mm/px · z∈[+1163,+1200]mm · 2 of 278 slices shown]
[im 93/278  lung]
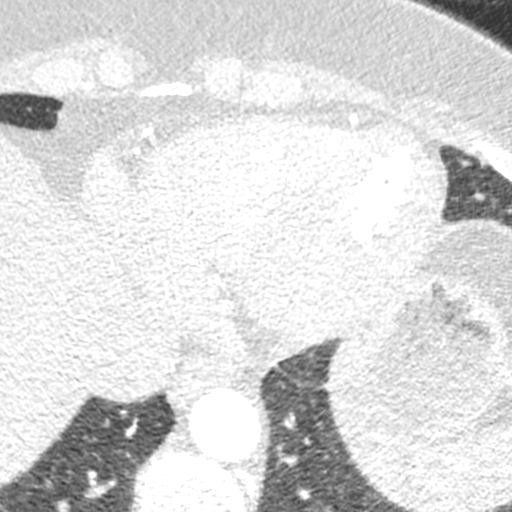
[im 185/278  lung]
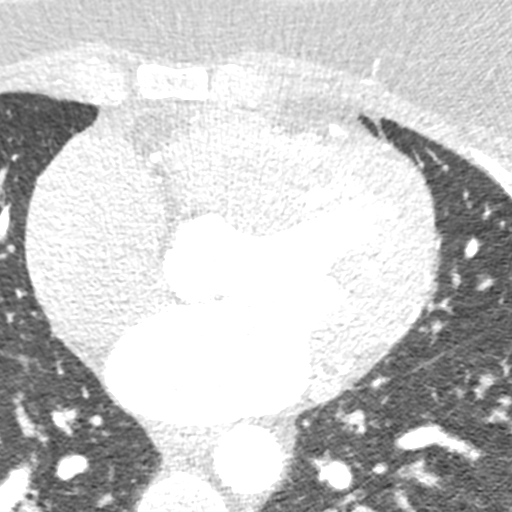

[Series 7: best syst · axial · 0.33mm/px · z∈[+1163,+1200]mm · 2 of 278 slices shown, 3 images]
[im 93/278  vessel]
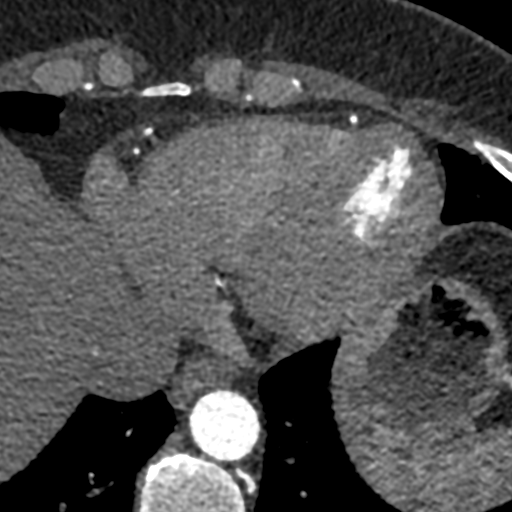
[im 93/278  lung]
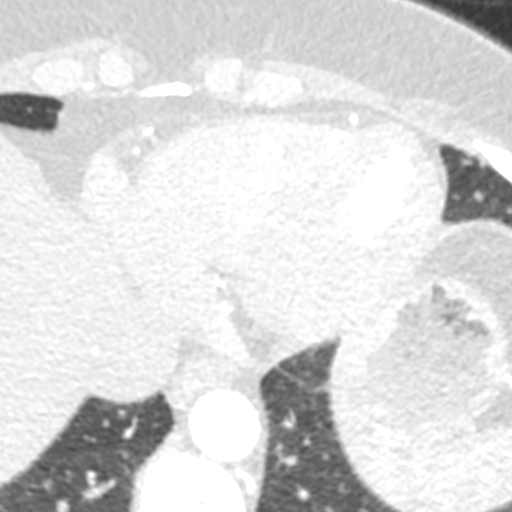
[im 185/278  vessel]
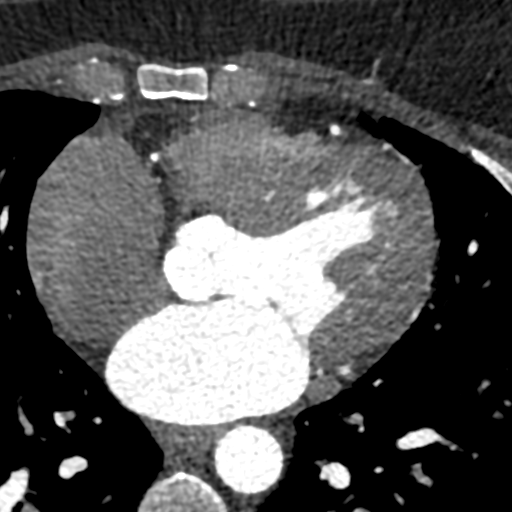

[Series 8: ts diast sharp · axial · 0.33mm/px · z∈[+1163,+1200]mm · 2 of 278 slices shown]
[im 93/278  lung]
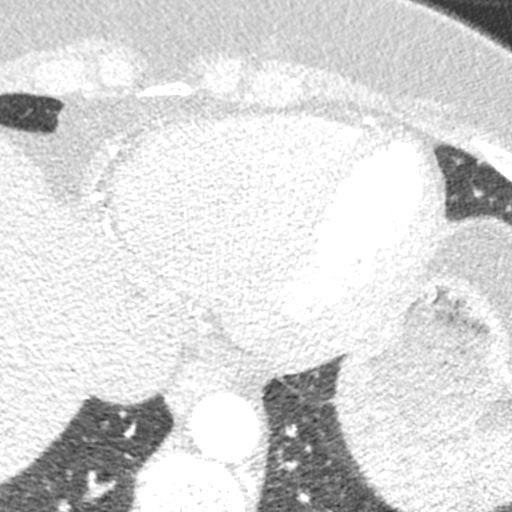
[im 185/278  lung]
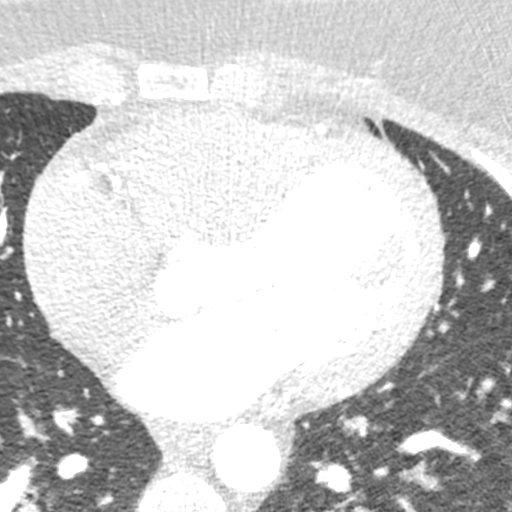

[Series 9: best diast · axial · 0.33mm/px · z∈[+1163,+1200]mm · 2 of 278 slices shown]
[im 93/278  vessel]
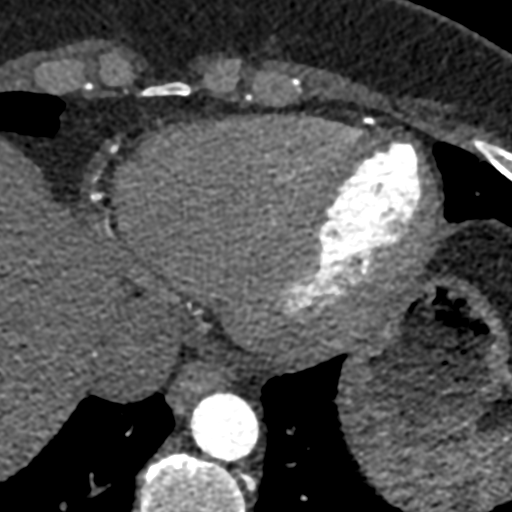
[im 185/278  vessel]
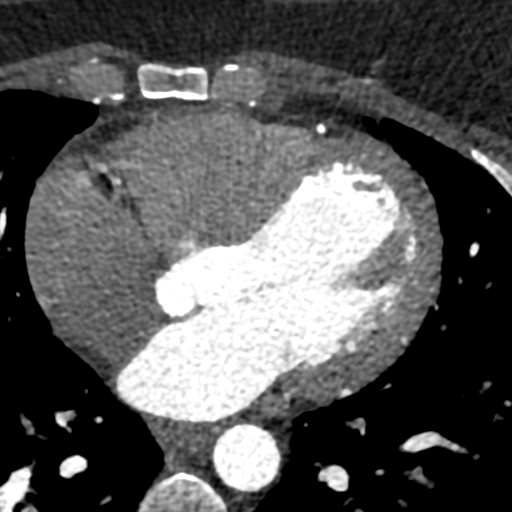

[8 of 20 positions shown; findings below may reference images not displayed]

FINDINGS: Vascular: Heart is normal size.  Aorta normal caliber.

Mediastinum/Nodes: No adenopathy

Lungs/Pleura: No confluent opacity or effusion.

Upper Abdomen: Imaging into the upper abdomen demonstrates no acute
findings.

Musculoskeletal: Chest wall soft tissues are unremarkable. No acute
bony abnormality.
IMPRESSION: No acute or significant extracardiac abnormality.
FINDINGS: Non-cardiac: See separate report from [REDACTED].

Pulmonary veins drained normally to the left atrium.

Calcium Score: 0 Agatston units.

Coronary Arteries: Right dominant with no anomalies

LM: No plaque or stenosis.

LAD system: No plaque or stenosis.

Circumflex system: No plaque or stenosis.

RCA system: No plaque or stenosis.
IMPRESSION: 1. Coronary artery calcium score 0 Agatston units. This suggests low
risk for future cardiac events.

2.  No significant coronary disease noted.

Mumy Nab

*** End of Addendum ***
EXAM:
OVER-READ INTERPRETATION  CT CHEST

The following report is an over-read performed by radiologist Dr.
Chon Pearson [REDACTED] on 01/03/2022. This over-read
does not include interpretation of cardiac or coronary anatomy or
pathology. The coronary CTA interpretation by the cardiologist is
attached.
FINDINGS: Vascular: Heart is normal size.  Aorta normal caliber.

Mediastinum/Nodes: No adenopathy

Lungs/Pleura: No confluent opacity or effusion.

Upper Abdomen: Imaging into the upper abdomen demonstrates no acute
findings.

Musculoskeletal: Chest wall soft tissues are unremarkable. No acute
bony abnormality.
IMPRESSION: No acute or significant extracardiac abnormality.

## 2022-01-03 MED ORDER — NITROGLYCERIN 0.4 MG SL SUBL
0.8000 mg | SUBLINGUAL_TABLET | Freq: Once | SUBLINGUAL | Status: AC
  Administered 2022-01-03: 0.8 mg via SUBLINGUAL

## 2022-01-03 MED ORDER — IOHEXOL 350 MG/ML SOLN
100.0000 mL | Freq: Once | INTRAVENOUS | Status: AC | PRN
  Administered 2022-01-03: 100 mL via INTRAVENOUS

## 2022-03-15 ENCOUNTER — Telehealth: Payer: Self-pay

## 2022-03-15 NOTE — Telephone Encounter (Signed)
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  Requesting an evening class at [facility] -explained do not have that currently. Will check back in when we do

## 2022-07-15 ENCOUNTER — Encounter (HOSPITAL_BASED_OUTPATIENT_CLINIC_OR_DEPARTMENT_OTHER): Payer: Self-pay | Admitting: Cardiovascular Disease

## 2023-10-30 ENCOUNTER — Ambulatory Visit (HOSPITAL_BASED_OUTPATIENT_CLINIC_OR_DEPARTMENT_OTHER): Payer: No Typology Code available for payment source | Admitting: Family

## 2023-12-12 ENCOUNTER — Ambulatory Visit (INDEPENDENT_AMBULATORY_CARE_PROVIDER_SITE_OTHER): Payer: No Typology Code available for payment source | Admitting: Cardiovascular Disease

## 2023-12-12 ENCOUNTER — Encounter (HOSPITAL_BASED_OUTPATIENT_CLINIC_OR_DEPARTMENT_OTHER): Payer: Self-pay | Admitting: Cardiovascular Disease

## 2023-12-12 VITALS — BP 122/78 | HR 64 | Ht 69.5 in | Wt 251.8 lb

## 2023-12-12 DIAGNOSIS — I10 Essential (primary) hypertension: Secondary | ICD-10-CM

## 2023-12-12 DIAGNOSIS — R609 Edema, unspecified: Secondary | ICD-10-CM

## 2023-12-12 DIAGNOSIS — R0789 Other chest pain: Secondary | ICD-10-CM

## 2023-12-12 HISTORY — DX: Edema, unspecified: R60.9

## 2023-12-12 NOTE — Progress Notes (Signed)
 Cardiology Office Note:  .   Date:  12/12/2023  ID:  Sabrina [name], DOB June 08, 1981, MRN [id number] PCP: Sabrina Dills, MD  [facility] Providers Cardiologist:  Sabrina Si, MD    History of Present Illness: Sabrina [name] is a 43 y.o. female here for follow-up. She was initially seen 07/11/2018 for the evaluation of chest pressure at the request of Sabrina Dills, MD.  For the preceding two years she had noted intermitted chest pressure. Her symptoms were very atypical. We offered an ETT and a calcium score but she declined both. We recommended increasing her exercise.  At her visit 12/2021 she was doing well.  She continued to have atypical chest discomfort.  She was referred for coronary CT-A that revealed normal coronary arteries and a calcium score of 0.  She was referred to the PREP program for exercise and weight loss.     Discussed the use of AI scribe software for clinical note transcription with the patient, who gave verbal consent to proceed.  History of Present Illness   Sabrina [name] has a history of hypertension with blood pressure readings reaching as high as 166/100 mmHg. She experiences occasional headaches associated with elevated readings. She uses her mother's old blood pressure machine to monitor her levels and is concerned about managing her blood pressure effectively.  She experiences swelling in her hands and feet, which prevents her from wearing her regular rings. Despite taking hydrochlorothiazide 12.5 mg, she continues to notice swelling, particularly in her extremities. She drinks water regularly and denies any shortness of breath when lying down.  She feels unmotivated and 'a little down and out lately,' impacting her ability to exercise. She acknowledges weight gain and wants to regain control over her health. Her work schedule and her daughter's activities make it challenging to find time for exercise.  Her diet has not been optimal, and she  finds it difficult to maintain a healthy diet as her family is not on the same 'health kick.' Her daughter is a Radiation protection practitioner, preferring only a few vegetables. She is considering meal prepping to improve her diet.  She takes levothyroxine in the morning on an empty stomach and usually takes her blood pressure medication later in the evening. She uses meloxicam as needed for back and foot pain, which she attributes to a past accident that caused a disc issue. She prefers not to rely on pain medication and only takes it when necessary.  There is a family history of heart disease, which is a concern for her.  She works from home and describes her lifestyle as sedentary. She has a daughter who is 15 years old and participates in volleyball, which adds to her busy schedule.     ROS:  As per HPI  Studies Reviewed: Marland Kitchen   EKG Interpretation Date/Time:  Tuesday December 12 2023 09:50:39 EST Ventricular Rate:  64 PR Interval:  176 QRS Duration:  90 QT Interval:  402 QTC Calculation: 414 R Axis:   -8  Text Interpretation: Normal sinus rhythm with sinus arrhythmia Normal ECG No significant change since last tracing Confirmed by Sabrina [name] ([id number]) on 12/12/2023 9:51:49 AM     Risk Assessment/Calculations:             Physical Exam:   VS:  BP 122/78   Pulse 64   Ht 5' 9.5" (1.765 m)   Wt 251 lb 12.8 oz (114.2 kg)   LMP 12/07/2023 (Approximate)   SpO2  98%   BMI 36.65 kg/m  , BMI Body mass index is 36.65 kg/m. GENERAL:  Well appearing HEENT: Pupils equal round and reactive, fundi not visualized, oral mucosa unremarkable NECK:  No jugular venous distention, waveform within normal limits, carotid upstroke brisk and symmetric, no bruits, no thyromegaly LUNGS:  Clear to auscultation bilaterally HEART:  RRR.  PMI not displaced or sustained,S1 and S2 within normal limits, no S3, no S4, no clicks, no rubs, no murmurs ABD:  Flat, positive bowel sounds normal in frequency in pitch, no bruits, no  rebound, no guarding, no midline pulsatile mass, no hepatomegaly, no splenomegaly EXT:  2 plus pulses throughout, no edema, no cyanosis no clubbing SKIN:  No rashes no nodules NEURO:  Cranial nerves II through XII grossly intact, motor grossly intact throughout PSYCH:  Cognitively intact, oriented to person place and time   ASSESSMENT AND PLAN: .       # Hypertension Elevated home blood pressure readings, currently on Hydrochlorothiazide 12.5mg  daily. BP is lower in the office than at home.  Discussed lifestyle modifications including diet and exercise. -Continue Hydrochlorothiazide 12.5mg  daily and amlodipine 5mg .  -Track blood pressure readings at home and bring blood pressure cuff to next appointment for accuracy check. -Consider increasing Hydrochlorothiazide to 25mg  daily if lifestyle modifications do not improve blood pressure control.  # Hyperlipidemia Elevated cholesterol levels, discussed lifestyle modifications as primary intervention.  However, ASDVD 10 year risk is still low.  -Repeat fasting lipid panel in 3 months after lifestyle changes.   # Hypothyroidism Currently on Levothyroxine, no reported issues. -Continue Levothyroxine as prescribed.  # Edema Reports of swelling in hands and lower legs, currently on Hydrochlorothiazide 12.5mg  daily. -Order echocardiogram to rule out structural heart disease. -Consider increasing Hydrochlorothiazide to 25mg  daily if echocardiogram is normal and lifestyle modifications do not improve edema.  # Obesity Patient expressed desire to improve health and lose weight. Discussed lifestyle modifications including diet and exercise. -Encourage regular physical activity, such as walking during daughter's volleyball practice. -Encourage meal prepping and a Mediterranean diet. -Follow up in 3 months with nurse practitioner for cardiovascular prevention.  # Chronic Pain Reports of intermittent back pain and foot pain, currently taking  Meloxicam as needed. -Continue Meloxicam as needed. -Consider regular use of Tylenol for pain control if frequency of Meloxicam use increases.       Dispo: f/u 3 months  Signed, Sabrina Si, MD

## 2023-12-12 NOTE — Patient Instructions (Signed)
 Medication Instructions:  Your physician recommends that you continue on your current medications as directed. Please refer to the Current Medication list given to you today.   *If you need a refill on your cardiac medications before your next appointment, please call your pharmacy*  Lab Work: FASTING LP/CMET WEEK PRIOR TO FOLLOW UP   If you have labs (blood work) drawn today and your tests are completely normal, you will receive your results only by: MyChart Message (if you have MyChart) OR A paper copy in the mail If you have any lab test that is abnormal or we need to change your treatment, we will call you to review the results.  Testing/Procedures: NONE  Follow-Up: At [facility] you and your health needs are our priority.  As part of our continuing mission to provide you with exceptional heart care, we have created designated Provider Care Teams.  These Care Teams include your primary Cardiologist (physician) and Advanced Practice Providers (APPs -  Physician Assistants and Nurse Practitioners) who all work together to provide you with the care you need, when you need it.  We recommend signing up for the patient portal called "MyChart".  Sign up information is provided on this After Visit Summary.  MyChart is used to connect with patients for Virtual Visits (Telemedicine).  Patients are able to view lab/test results, encounter notes, upcoming appointments, etc.  Non-urgent messages can be sent to your provider as well.   To learn more about what you can do with MyChart, go to [email or web address].    Your next appointment:   3 month(s)  Provider:   Eligha Bridegroom, NP    Other Instructions INCREASE YOUR EXERCISE TO 150 MINUTES EACH WEEK   WORK ON MEAL PLANNING

## 2024-02-26 NOTE — Progress Notes (Deleted)
 Cardiology Office Note:  .   Date:  02/26/2024  ID:  Sabrina [name], DOB [birth year], MRN [id number] PCP: Sabrina Star, MD  [facility] Providers Cardiologist:  Sabrina Sos, MD { Click to update primary MD,subspecialty MD or APP then REFRESH:1}   Patient Profile: .      PMH Hypertension Chest pain Coronary CTA 01/03/2022 CAC score 0 No significant CAD Hypothyroidism  Referred to cardiology and seen by Dr. Theodis [name] 07/2018 for chest pain. She reported chest pain for the previous 2 years occurring daily and lasting for 1-2 seconds. No associated symptoms and no worsening pain with exertion. She was being treated for hypertension. She declined further testing at that time and CV prevention was emphasized.  Seen again by Dr. Theodis [name] 12/20/21  with left sided chest pressure. She described this as "squeezing" pain associated with some sharp, burning sensations in her chest. She also noted a right-sided discomfort in the right-side that occurred with deep inspiration, coughing or sneezing. She was also having numbness in both arms. She endorsed shortness of breath with heavy activity which she attributed to her weight and deconditioning. Coronary CTA revealed no evidence of CAD, CAC score of 0.  Last cardiology clinic visit was 12/12/23 with Dr. Theodis [name]. She had BP readings as high as 166/100 mmHg with occasional headaches associated. She was concerned about accuracy of home cuff given its age. She reported swelling in her hands and feet which is preventing her from wearing her regular rings despite taking hydrochlorothiazide 12.5 mg daily.  She reported feeling unmotivated and "a little down and out lately" impacting her ability to exercise.  She acknowledged weight gain and wanting to regain control over her health.  She found it difficult to maintain healthy diet with her family's dietary choices.  She takes meloxicam  for previous back injury.  She was advised to continue  antihypertensive therapy of hydrochlorothiazide 12.5 mg daily and amlodipine 5 mg daily and to track home readings and bring cuff to next appointment.  Consider increasing hydrochlorothiazide to 25 mg daily if BP remains elevated.  She had elevated cholesterol levels, but with low ASCVD risk score recommendation to continue lifestyle modification.       History of Present Illness: Aaron Aas   [name] is a *** 43 y.o. female ***  Consider echo  Discussed the use of AI scribe software for clinical note transcription with the patient, who gave verbal consent to proceed.   ROS: ***       Studies Reviewed: Aaron Aas         No results found for: "LIPOA"   *** Risk Assessment/Calculations:   {Does this patient have ATRIAL FIBRILLATION?:[phone]} No BP recorded.  {Refresh Note OR Click here to enter BP  :1}***       Physical Exam:   VS:  There were no vitals taken for this visit.   Wt Readings from Last 3 Encounters:  12/12/23 251 lb 12.8 oz (114.2 kg)  12/20/21 246 lb 3.2 oz (111.7 kg)  07/11/18 215 lb (97.5 kg)    GEN: Well nourished, well developed in no acute distress NECK: No JVD; No carotid bruits CARDIAC: ***RRR, no murmurs, rubs, gallops RESPIRATORY:  Clear to auscultation without rales, wheezing or rhonchi  ABDOMEN: Soft, non-tender, non-distended EXTREMITIES:  No edema; No deformity     ASSESSMENT AND PLAN: .   ***    {Are you ordering a CV Procedure (e.g. stress test, cath, DCCV, TEE, etc)?   Press F2        :  [id number]}  Disposition:***  Signed, Slater Duncan, NP-C

## 2024-03-11 ENCOUNTER — Ambulatory Visit (HOSPITAL_BASED_OUTPATIENT_CLINIC_OR_DEPARTMENT_OTHER): Admitting: Nurse Practitioner
